# Patient Record
Sex: Female | Born: 1980 | Race: Black or African American | Hispanic: No | Marital: Single | Smoking: Never smoker
Health system: Southern US, Community
[De-identification: ages and names within clinical notes are randomized; demographics above are authoritative.]

## PROBLEM LIST (undated history)

## (undated) DIAGNOSIS — J302 Other seasonal allergic rhinitis: Secondary | ICD-10-CM

## (undated) DIAGNOSIS — I10 Essential (primary) hypertension: Secondary | ICD-10-CM

## (undated) DIAGNOSIS — F32A Depression, unspecified: Secondary | ICD-10-CM

## (undated) DIAGNOSIS — J45909 Unspecified asthma, uncomplicated: Secondary | ICD-10-CM

## (undated) DIAGNOSIS — F909 Attention-deficit hyperactivity disorder, unspecified type: Secondary | ICD-10-CM

## (undated) HISTORY — PX: TONSILLECTOMY: SUR1361

## (undated) HISTORY — PX: TUBAL LIGATION: SHX77

## (undated) HISTORY — PX: OTHER SURGICAL HISTORY: SHX169

## (undated) HISTORY — DX: Depression, unspecified: F32.A

---

## 2000-06-24 ENCOUNTER — Inpatient Hospital Stay (HOSPITAL_COMMUNITY): Admission: AD | Admit: 2000-06-24 | Discharge: 2000-06-24 | Payer: Self-pay | Admitting: Obstetrics & Gynecology

## 2000-07-22 ENCOUNTER — Inpatient Hospital Stay (HOSPITAL_COMMUNITY): Admission: AD | Admit: 2000-07-22 | Discharge: 2000-07-22 | Payer: Self-pay | Admitting: Obstetrics & Gynecology

## 2000-07-24 ENCOUNTER — Inpatient Hospital Stay (HOSPITAL_COMMUNITY): Admission: AD | Admit: 2000-07-24 | Discharge: 2000-07-24 | Payer: Self-pay | Admitting: Obstetrics

## 2000-07-27 ENCOUNTER — Encounter: Admission: RE | Admit: 2000-07-27 | Discharge: 2000-07-27 | Payer: Self-pay | Admitting: Obstetrics & Gynecology

## 2001-06-21 ENCOUNTER — Inpatient Hospital Stay (HOSPITAL_COMMUNITY): Admission: AD | Admit: 2001-06-21 | Discharge: 2001-06-21 | Payer: Self-pay | Admitting: *Deleted

## 2001-06-24 ENCOUNTER — Inpatient Hospital Stay (HOSPITAL_COMMUNITY): Admission: AD | Admit: 2001-06-24 | Discharge: 2001-06-24 | Payer: Self-pay | Admitting: *Deleted

## 2001-07-05 ENCOUNTER — Inpatient Hospital Stay (HOSPITAL_COMMUNITY): Admission: AD | Admit: 2001-07-05 | Discharge: 2001-07-05 | Payer: Self-pay | Admitting: *Deleted

## 2001-07-23 ENCOUNTER — Emergency Department (HOSPITAL_COMMUNITY): Admission: EM | Admit: 2001-07-23 | Discharge: 2001-07-23 | Payer: Self-pay | Admitting: Emergency Medicine

## 2001-07-23 ENCOUNTER — Encounter: Payer: Self-pay | Admitting: *Deleted

## 2002-02-09 ENCOUNTER — Ambulatory Visit (HOSPITAL_COMMUNITY): Admission: RE | Admit: 2002-02-09 | Discharge: 2002-02-09 | Payer: Self-pay | Admitting: *Deleted

## 2002-04-16 ENCOUNTER — Inpatient Hospital Stay (HOSPITAL_COMMUNITY): Admission: AD | Admit: 2002-04-16 | Discharge: 2002-04-16 | Payer: Self-pay | Admitting: *Deleted

## 2002-05-05 ENCOUNTER — Inpatient Hospital Stay (HOSPITAL_COMMUNITY): Admission: AD | Admit: 2002-05-05 | Discharge: 2002-05-10 | Payer: Self-pay | Admitting: Obstetrics and Gynecology

## 2002-05-06 ENCOUNTER — Encounter: Payer: Self-pay | Admitting: Obstetrics and Gynecology

## 2002-05-07 ENCOUNTER — Encounter: Payer: Self-pay | Admitting: Obstetrics and Gynecology

## 2002-05-07 ENCOUNTER — Encounter: Payer: Self-pay | Admitting: *Deleted

## 2002-05-12 ENCOUNTER — Encounter: Admission: RE | Admit: 2002-05-12 | Discharge: 2002-05-12 | Payer: Self-pay | Admitting: Family Medicine

## 2002-05-20 ENCOUNTER — Inpatient Hospital Stay (HOSPITAL_COMMUNITY): Admission: AD | Admit: 2002-05-20 | Discharge: 2002-05-20 | Payer: Self-pay | Admitting: *Deleted

## 2002-05-22 ENCOUNTER — Inpatient Hospital Stay (HOSPITAL_COMMUNITY): Admission: AD | Admit: 2002-05-22 | Discharge: 2002-05-22 | Payer: Self-pay | Admitting: *Deleted

## 2002-06-25 ENCOUNTER — Inpatient Hospital Stay (HOSPITAL_COMMUNITY): Admission: AD | Admit: 2002-06-25 | Discharge: 2002-06-25 | Payer: Self-pay | Admitting: Family Medicine

## 2002-06-30 ENCOUNTER — Ambulatory Visit (HOSPITAL_COMMUNITY): Admission: RE | Admit: 2002-06-30 | Discharge: 2002-06-30 | Payer: Self-pay | Admitting: *Deleted

## 2002-07-07 ENCOUNTER — Inpatient Hospital Stay (HOSPITAL_COMMUNITY): Admission: AD | Admit: 2002-07-07 | Discharge: 2002-07-07 | Payer: Self-pay | Admitting: *Deleted

## 2002-07-10 ENCOUNTER — Encounter: Payer: Self-pay | Admitting: Family Medicine

## 2002-07-10 ENCOUNTER — Inpatient Hospital Stay (HOSPITAL_COMMUNITY): Admission: AD | Admit: 2002-07-10 | Discharge: 2002-07-10 | Payer: Self-pay | Admitting: *Deleted

## 2002-07-12 ENCOUNTER — Inpatient Hospital Stay (HOSPITAL_COMMUNITY): Admission: AD | Admit: 2002-07-12 | Discharge: 2002-07-12 | Payer: Self-pay | Admitting: Obstetrics and Gynecology

## 2002-07-15 ENCOUNTER — Inpatient Hospital Stay (HOSPITAL_COMMUNITY): Admission: AD | Admit: 2002-07-15 | Discharge: 2002-07-18 | Payer: Self-pay | Admitting: *Deleted

## 2003-06-22 ENCOUNTER — Emergency Department (HOSPITAL_COMMUNITY): Admission: EM | Admit: 2003-06-22 | Discharge: 2003-06-22 | Payer: Self-pay | Admitting: Emergency Medicine

## 2004-12-25 ENCOUNTER — Ambulatory Visit (HOSPITAL_COMMUNITY): Admission: RE | Admit: 2004-12-25 | Discharge: 2004-12-25 | Payer: Self-pay | Admitting: *Deleted

## 2004-12-29 ENCOUNTER — Inpatient Hospital Stay (HOSPITAL_COMMUNITY): Admission: AD | Admit: 2004-12-29 | Discharge: 2004-12-29 | Payer: Self-pay | Admitting: Obstetrics and Gynecology

## 2005-01-16 ENCOUNTER — Inpatient Hospital Stay (HOSPITAL_COMMUNITY): Admission: RE | Admit: 2005-01-16 | Discharge: 2005-01-16 | Payer: Self-pay | Admitting: Obstetrics & Gynecology

## 2005-01-16 ENCOUNTER — Ambulatory Visit: Payer: Self-pay | Admitting: *Deleted

## 2005-04-09 ENCOUNTER — Inpatient Hospital Stay (HOSPITAL_COMMUNITY): Admission: AD | Admit: 2005-04-09 | Discharge: 2005-04-10 | Payer: Self-pay | Admitting: Obstetrics & Gynecology

## 2005-04-16 ENCOUNTER — Ambulatory Visit: Payer: Self-pay | Admitting: Obstetrics and Gynecology

## 2005-04-16 ENCOUNTER — Ambulatory Visit (HOSPITAL_COMMUNITY): Admission: RE | Admit: 2005-04-16 | Discharge: 2005-04-16 | Payer: Self-pay | Admitting: Obstetrics & Gynecology

## 2005-04-16 ENCOUNTER — Encounter (INDEPENDENT_AMBULATORY_CARE_PROVIDER_SITE_OTHER): Payer: Self-pay | Admitting: Specialist

## 2005-05-07 ENCOUNTER — Ambulatory Visit: Payer: Self-pay | Admitting: Obstetrics and Gynecology

## 2005-12-22 ENCOUNTER — Inpatient Hospital Stay (HOSPITAL_COMMUNITY): Admission: AD | Admit: 2005-12-22 | Discharge: 2005-12-22 | Payer: Self-pay | Admitting: Obstetrics and Gynecology

## 2006-01-05 ENCOUNTER — Inpatient Hospital Stay (HOSPITAL_COMMUNITY): Admission: AD | Admit: 2006-01-05 | Discharge: 2006-01-05 | Payer: Self-pay | Admitting: Family Medicine

## 2006-01-06 ENCOUNTER — Ambulatory Visit: Payer: Self-pay | Admitting: Family Medicine

## 2006-02-01 ENCOUNTER — Other Ambulatory Visit: Admission: RE | Admit: 2006-02-01 | Discharge: 2006-02-01 | Payer: Self-pay

## 2006-02-01 ENCOUNTER — Ambulatory Visit: Payer: Self-pay | Admitting: Family Medicine

## 2006-02-19 ENCOUNTER — Ambulatory Visit (HOSPITAL_COMMUNITY): Admission: RE | Admit: 2006-02-19 | Discharge: 2006-02-19 | Payer: Self-pay | Admitting: Family Medicine

## 2006-03-04 ENCOUNTER — Ambulatory Visit: Payer: Self-pay | Admitting: Family Medicine

## 2006-03-05 ENCOUNTER — Ambulatory Visit (HOSPITAL_COMMUNITY): Admission: AD | Admit: 2006-03-05 | Discharge: 2006-03-05 | Payer: Self-pay | Admitting: Family Medicine

## 2006-03-31 ENCOUNTER — Ambulatory Visit: Payer: Self-pay | Admitting: Sports Medicine

## 2006-04-13 ENCOUNTER — Ambulatory Visit: Payer: Self-pay | Admitting: Family Medicine

## 2006-05-03 ENCOUNTER — Ambulatory Visit: Payer: Self-pay | Admitting: Family Medicine

## 2006-05-05 ENCOUNTER — Inpatient Hospital Stay (HOSPITAL_COMMUNITY): Admission: AD | Admit: 2006-05-05 | Discharge: 2006-05-05 | Payer: Self-pay | Admitting: Gynecology

## 2006-05-05 ENCOUNTER — Ambulatory Visit: Payer: Self-pay | Admitting: *Deleted

## 2006-06-09 ENCOUNTER — Ambulatory Visit: Payer: Self-pay | Admitting: Sports Medicine

## 2006-06-09 ENCOUNTER — Encounter (INDEPENDENT_AMBULATORY_CARE_PROVIDER_SITE_OTHER): Payer: Self-pay | Admitting: Family Medicine

## 2006-06-09 LAB — CONVERTED CEMR LAB
Alkaline Phosphatase: 117 units/L (ref 39–117)
BUN: 7 mg/dL (ref 6–23)
CO2: 21 meq/L (ref 19–32)
Creatinine, Ser: 0.48 mg/dL (ref 0.40–1.20)
Glucose, Bld: 79 mg/dL (ref 70–99)
LDH: 147 units/L (ref 94–250)
Sodium: 137 meq/L (ref 135–145)
Total Bilirubin: 0.3 mg/dL (ref 0.3–1.2)
Total Protein: 6.6 g/dL (ref 6.0–8.3)
Uric Acid, Serum: 3.2 mg/dL (ref 2.4–7.0)

## 2006-06-14 ENCOUNTER — Inpatient Hospital Stay (HOSPITAL_COMMUNITY): Admission: AD | Admit: 2006-06-14 | Discharge: 2006-06-14 | Payer: Self-pay | Admitting: Gynecology

## 2006-06-17 ENCOUNTER — Ambulatory Visit: Payer: Self-pay | Admitting: *Deleted

## 2006-06-17 ENCOUNTER — Inpatient Hospital Stay (HOSPITAL_COMMUNITY): Admission: AD | Admit: 2006-06-17 | Discharge: 2006-06-17 | Payer: Self-pay | Admitting: Family Medicine

## 2006-06-30 ENCOUNTER — Encounter (INDEPENDENT_AMBULATORY_CARE_PROVIDER_SITE_OTHER): Payer: Self-pay | Admitting: Family Medicine

## 2006-06-30 ENCOUNTER — Ambulatory Visit: Payer: Self-pay | Admitting: Family Medicine

## 2006-06-30 LAB — CONVERTED CEMR LAB
Chlamydia, DNA Probe: NEGATIVE
GC Probe Amp, Genital: NEGATIVE

## 2006-07-07 ENCOUNTER — Ambulatory Visit: Payer: Self-pay | Admitting: Family Medicine

## 2006-07-18 ENCOUNTER — Inpatient Hospital Stay (HOSPITAL_COMMUNITY): Admission: AD | Admit: 2006-07-18 | Discharge: 2006-07-18 | Payer: Self-pay | Admitting: Gynecology

## 2006-07-18 ENCOUNTER — Ambulatory Visit: Payer: Self-pay | Admitting: Obstetrics and Gynecology

## 2006-07-20 ENCOUNTER — Ambulatory Visit: Payer: Self-pay

## 2006-07-21 ENCOUNTER — Ambulatory Visit: Payer: Self-pay | Admitting: Obstetrics & Gynecology

## 2006-07-23 ENCOUNTER — Ambulatory Visit: Payer: Self-pay | Admitting: Obstetrics & Gynecology

## 2006-07-25 ENCOUNTER — Ambulatory Visit: Payer: Self-pay | Admitting: Obstetrics and Gynecology

## 2006-07-25 ENCOUNTER — Inpatient Hospital Stay (HOSPITAL_COMMUNITY): Admission: AD | Admit: 2006-07-25 | Discharge: 2006-07-25 | Payer: Self-pay | Admitting: Family Medicine

## 2006-07-25 ENCOUNTER — Inpatient Hospital Stay (HOSPITAL_COMMUNITY): Admission: AD | Admit: 2006-07-25 | Discharge: 2006-07-28 | Payer: Self-pay | Admitting: Obstetrics

## 2006-08-05 DIAGNOSIS — J45909 Unspecified asthma, uncomplicated: Secondary | ICD-10-CM | POA: Insufficient documentation

## 2006-08-15 IMAGING — US US OB COMP LESS 14 WK
1 series · 14 of 18 positions shown · non-contrast
Comparison: none

CLINICAL DATA: Back pain.  
 OBSTETRICAL ULTRASOUND <14 WKS:
TECHNIQUE: Transabdominal ultrasound was performed for evaluation of the gestation as well as the maternal uterus and adnexal regions.

[Series 1: us ob comp less 14 wk · 0.29mm/px · 14 of 18 slices shown]
[im 1/18]
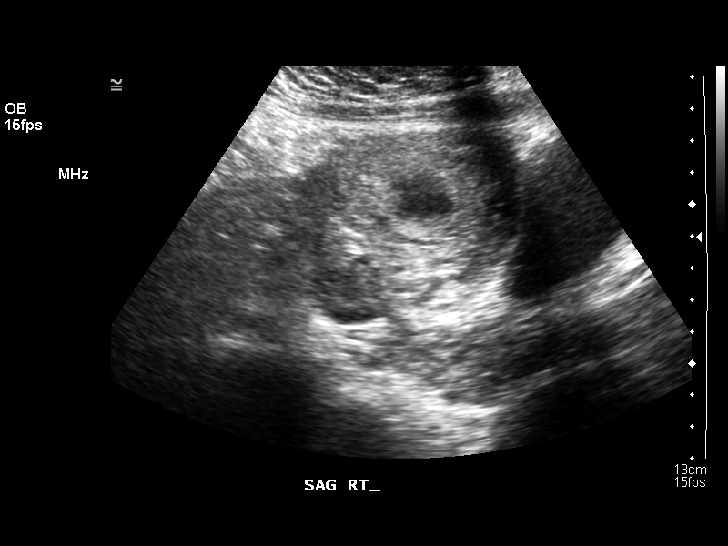
[im 2/18]
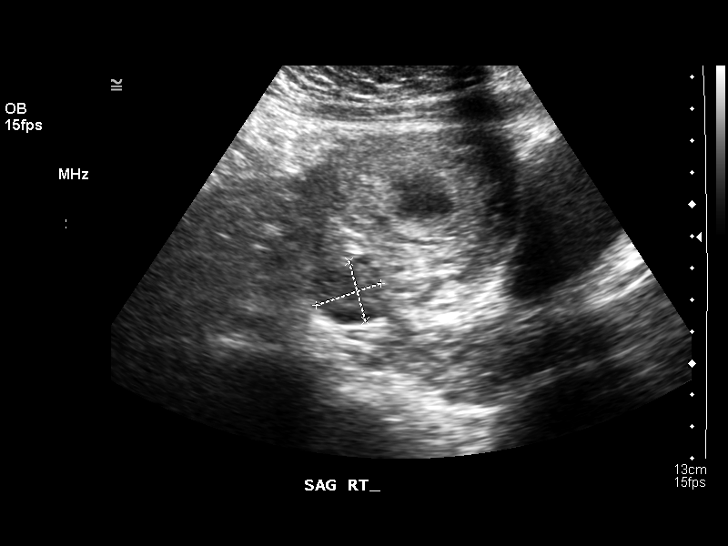
[im 4/18]
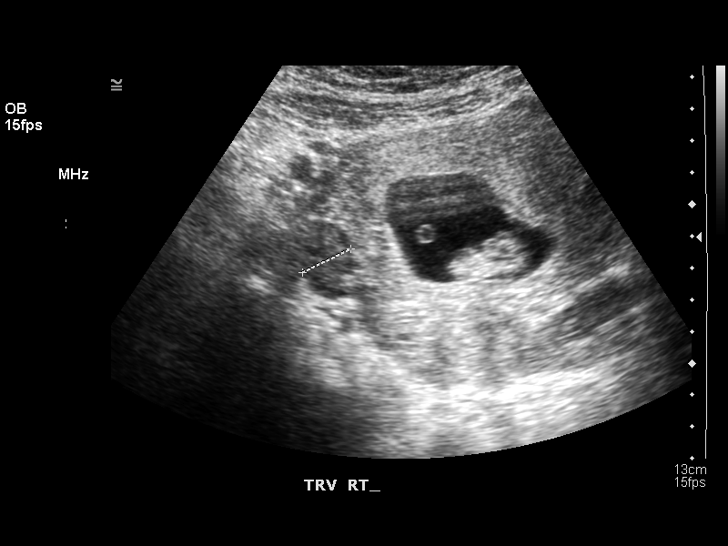
[im 5/18]
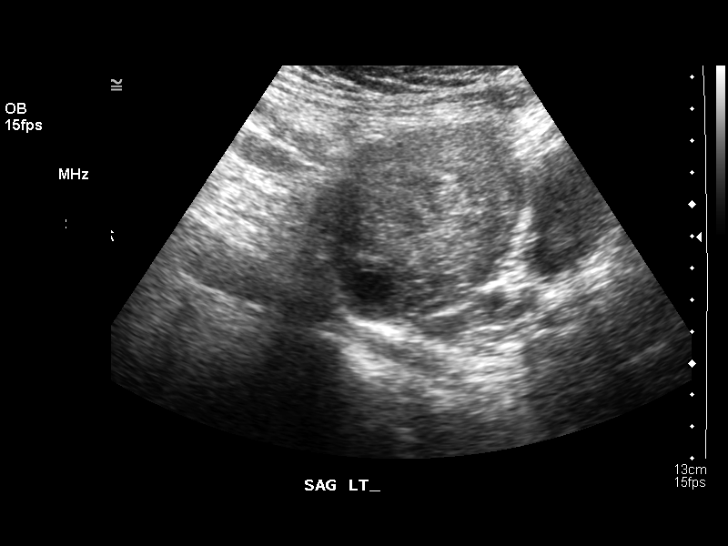
[im 6/18]
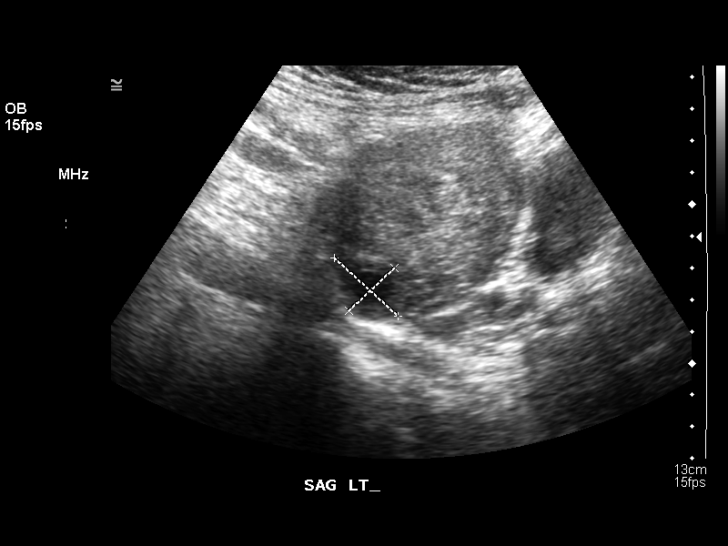
[im 8/18]
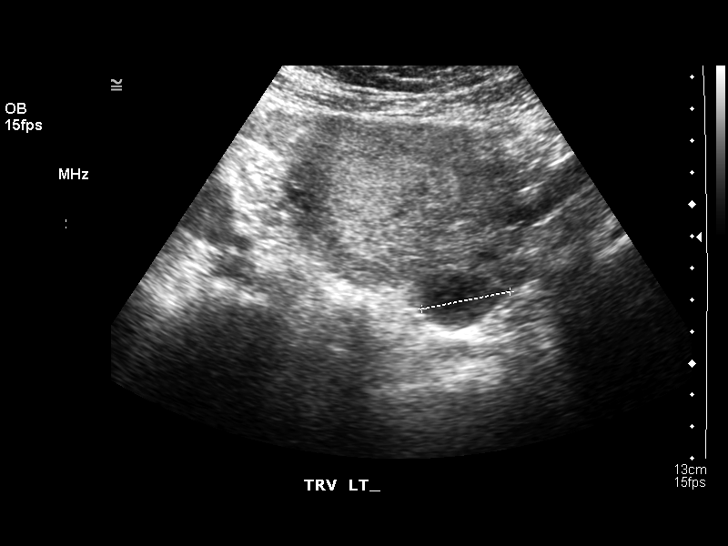
[im 9/18]
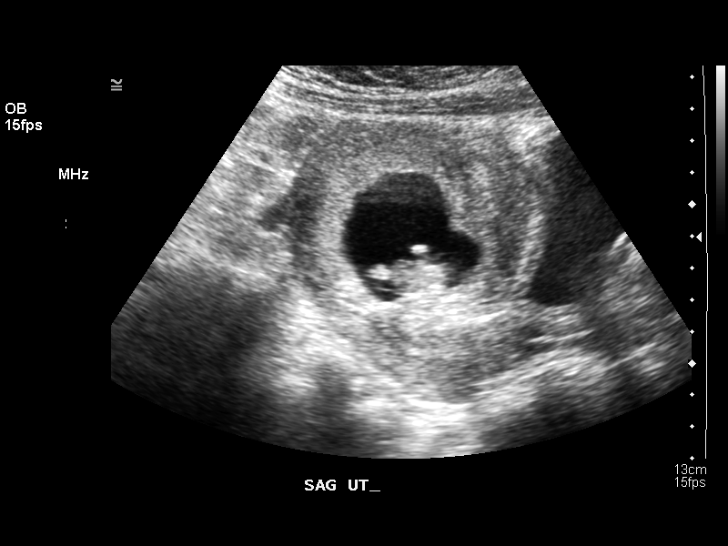
[im 10/18]
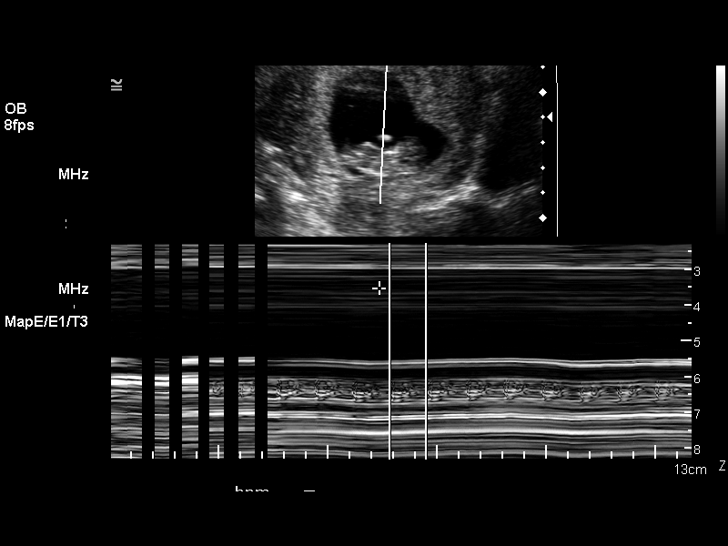
[im 11/18]
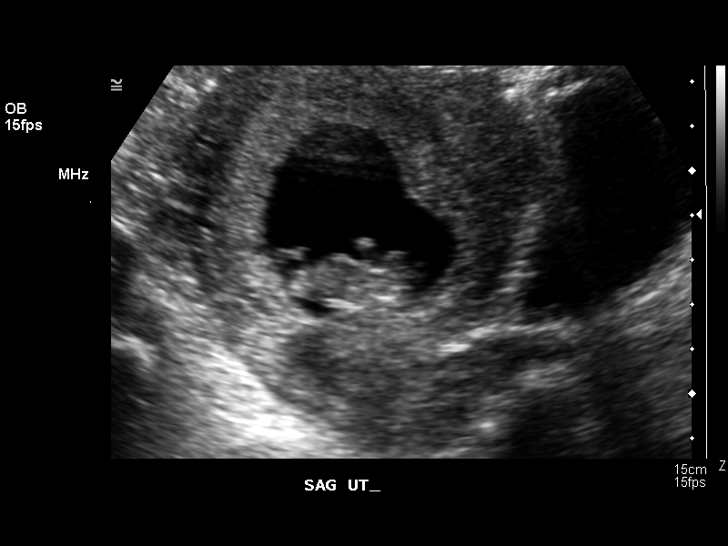
[im 13/18]
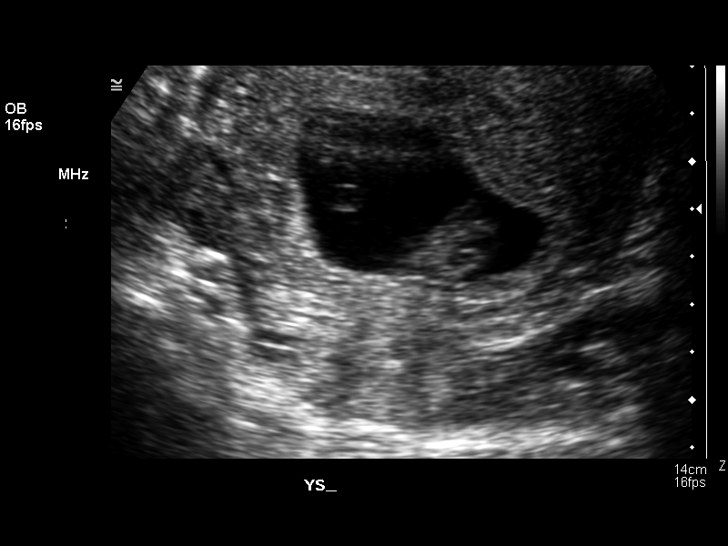
[im 14/18]
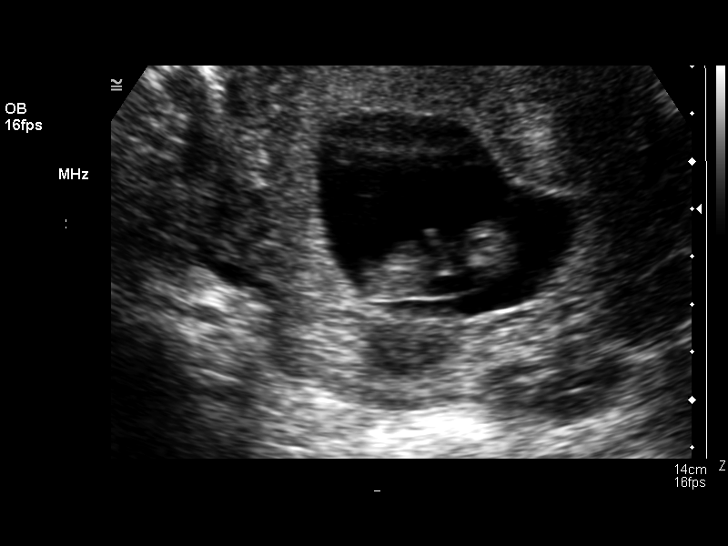
[im 15/18]
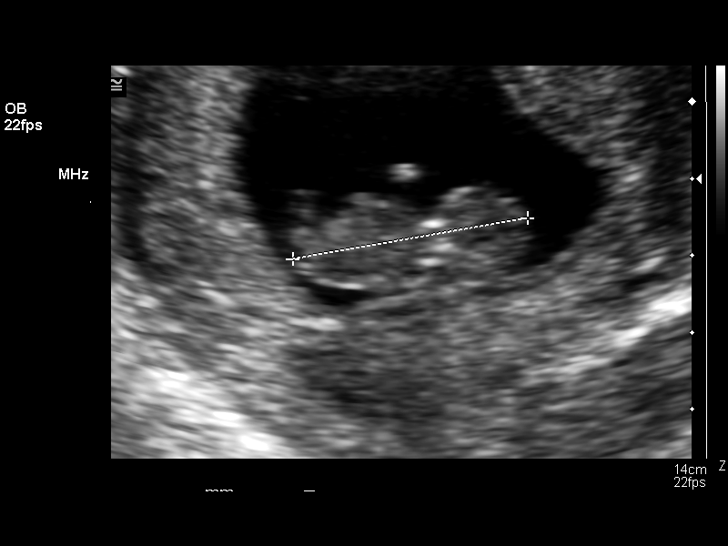
[im 17/18]
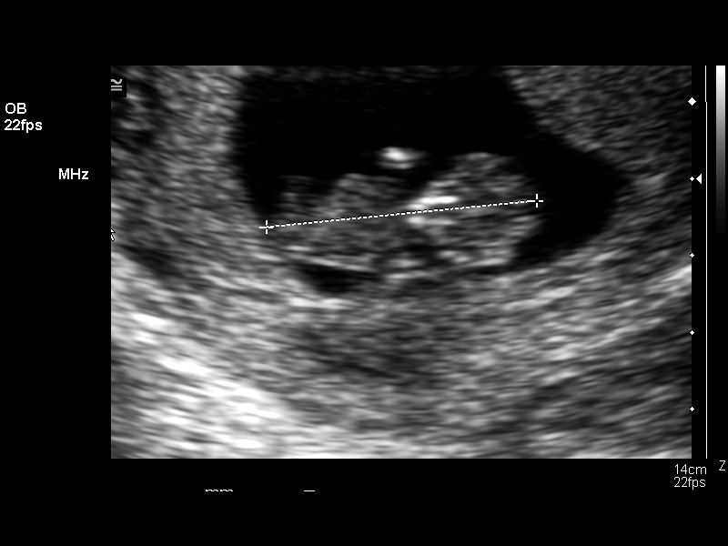
[im 18/18]
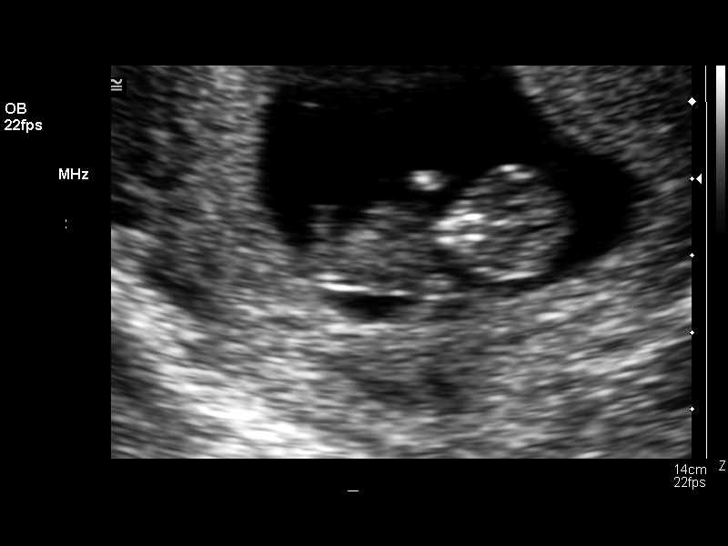

[14 of 18 positions shown; findings below may reference images not displayed]

FINDINGS: There is a single viable intrauterine gestation with crown rump length of 3.5 cm consistent with gestational age of 10 weeks and 3 days with EDC 07/17/06.  Fetal heart rate 180.  Fetal motion was noted, but not fetal breathing.  The fetus was in variable lie during the examination.  Normal amount of amniotic fluid.  Fetal anatomy was evaluated and this can be further delineated on follow-up.  Yolk sac and amnion were noted.  
 Ovaries have a normal appearance with a corpus luteum cyst along the left ovary.
IMPRESSION: 1.  Single viable intrauterine gestation with crown rump length suggesting 10 week 3 day gestational age with EDC of 07/17/06.  Heart rate 180.  Fetal anatomy not evaluated.
 2.  Normal amount of amniotic fluid.

## 2006-11-10 ENCOUNTER — Encounter (INDEPENDENT_AMBULATORY_CARE_PROVIDER_SITE_OTHER): Payer: Self-pay | Admitting: Family Medicine

## 2006-11-10 ENCOUNTER — Ambulatory Visit: Payer: Self-pay | Admitting: Family Medicine

## 2006-11-10 DIAGNOSIS — I10 Essential (primary) hypertension: Secondary | ICD-10-CM

## 2006-11-10 DIAGNOSIS — J309 Allergic rhinitis, unspecified: Secondary | ICD-10-CM | POA: Insufficient documentation

## 2006-11-10 DIAGNOSIS — N898 Other specified noninflammatory disorders of vagina: Secondary | ICD-10-CM | POA: Insufficient documentation

## 2006-11-10 LAB — CONVERTED CEMR LAB: GC Probe Amp, Genital: NEGATIVE

## 2006-12-13 ENCOUNTER — Ambulatory Visit: Payer: Self-pay | Admitting: Family Medicine

## 2006-12-16 ENCOUNTER — Ambulatory Visit: Payer: Self-pay | Admitting: Sports Medicine

## 2007-01-13 ENCOUNTER — Ambulatory Visit: Payer: Self-pay | Admitting: Family Medicine

## 2007-05-18 ENCOUNTER — Emergency Department (HOSPITAL_COMMUNITY): Admission: EM | Admit: 2007-05-18 | Discharge: 2007-05-18 | Payer: Self-pay | Admitting: Emergency Medicine

## 2007-05-19 ENCOUNTER — Encounter (INDEPENDENT_AMBULATORY_CARE_PROVIDER_SITE_OTHER): Payer: Self-pay | Admitting: Family Medicine

## 2007-05-19 ENCOUNTER — Other Ambulatory Visit: Admission: RE | Admit: 2007-05-19 | Discharge: 2007-05-19 | Payer: Self-pay | Admitting: Family Medicine

## 2007-05-19 ENCOUNTER — Ambulatory Visit: Payer: Self-pay | Admitting: Family Medicine

## 2007-05-19 LAB — CONVERTED CEMR LAB
Chlamydia, DNA Probe: NEGATIVE
GC Probe Amp, Genital: NEGATIVE

## 2007-05-25 ENCOUNTER — Encounter (INDEPENDENT_AMBULATORY_CARE_PROVIDER_SITE_OTHER): Payer: Self-pay | Admitting: Family Medicine

## 2007-05-25 LAB — CONVERTED CEMR LAB: Pap Smear: NORMAL

## 2007-05-29 ENCOUNTER — Encounter (INDEPENDENT_AMBULATORY_CARE_PROVIDER_SITE_OTHER): Payer: Self-pay | Admitting: Family Medicine

## 2007-08-17 ENCOUNTER — Encounter: Payer: Self-pay | Admitting: *Deleted

## 2007-08-21 ENCOUNTER — Emergency Department (HOSPITAL_COMMUNITY): Admission: EM | Admit: 2007-08-21 | Discharge: 2007-08-21 | Payer: Self-pay | Admitting: Emergency Medicine

## 2007-09-01 ENCOUNTER — Inpatient Hospital Stay (HOSPITAL_COMMUNITY): Admission: AD | Admit: 2007-09-01 | Discharge: 2007-09-02 | Payer: Self-pay | Admitting: Obstetrics & Gynecology

## 2008-02-08 ENCOUNTER — Encounter: Payer: Self-pay | Admitting: *Deleted

## 2008-04-28 ENCOUNTER — Emergency Department (HOSPITAL_COMMUNITY): Admission: EM | Admit: 2008-04-28 | Discharge: 2008-04-28 | Payer: Self-pay | Admitting: Emergency Medicine

## 2008-07-12 ENCOUNTER — Ambulatory Visit: Payer: Self-pay | Admitting: Family Medicine

## 2008-07-12 DIAGNOSIS — Z8742 Personal history of other diseases of the female genital tract: Secondary | ICD-10-CM

## 2008-07-12 DIAGNOSIS — E669 Obesity, unspecified: Secondary | ICD-10-CM

## 2008-07-12 DIAGNOSIS — N912 Amenorrhea, unspecified: Secondary | ICD-10-CM

## 2008-07-12 LAB — CONVERTED CEMR LAB: Beta hcg, urine, semiquantitative: NEGATIVE

## 2008-08-19 ENCOUNTER — Emergency Department (HOSPITAL_COMMUNITY): Admission: EM | Admit: 2008-08-19 | Discharge: 2008-08-19 | Payer: Self-pay | Admitting: Emergency Medicine

## 2009-09-23 ENCOUNTER — Encounter: Payer: Self-pay | Admitting: Family Medicine

## 2010-01-31 ENCOUNTER — Other Ambulatory Visit: Admission: RE | Admit: 2010-01-31 | Discharge: 2010-01-31 | Payer: Self-pay | Admitting: Family Medicine

## 2010-01-31 ENCOUNTER — Ambulatory Visit: Payer: Self-pay | Admitting: Family Medicine

## 2010-01-31 DIAGNOSIS — L680 Hirsutism: Secondary | ICD-10-CM | POA: Insufficient documentation

## 2010-01-31 DIAGNOSIS — I10 Essential (primary) hypertension: Secondary | ICD-10-CM | POA: Insufficient documentation

## 2010-02-03 ENCOUNTER — Encounter: Admission: RE | Admit: 2010-02-03 | Discharge: 2010-02-03 | Payer: Self-pay | Admitting: Family Medicine

## 2010-02-05 ENCOUNTER — Ambulatory Visit: Payer: Self-pay | Admitting: Family Medicine

## 2010-02-05 ENCOUNTER — Encounter: Payer: Self-pay | Admitting: Family Medicine

## 2010-02-05 LAB — CONVERTED CEMR LAB
AST: 22 units/L (ref 0–37)
Albumin: 4 g/dL (ref 3.5–5.2)
Alkaline Phosphatase: 78 units/L (ref 39–117)
BUN: 11 mg/dL (ref 6–23)
Glucose, Bld: 88 mg/dL (ref 70–99)
HDL: 34 mg/dL — ABNORMAL LOW (ref >39)
LDL Cholesterol: 123 mg/dL — ABNORMAL HIGH (ref 0–99)
Potassium: 4.4 meq/L (ref 3.5–5.3)
Sodium: 138 meq/L (ref 135–145)
TSH: 2.665 microintl units/mL (ref 0.350–4.500)
Total Bilirubin: 0.4 mg/dL (ref 0.3–1.2)
Total Protein: 7.5 g/dL (ref 6.0–8.3)
Triglycerides: 129 mg/dL (ref <150)
VLDL: 26 mg/dL (ref 0–40)

## 2010-02-06 ENCOUNTER — Encounter: Payer: Self-pay | Admitting: Family Medicine

## 2010-07-08 NOTE — Assessment & Plan Note (Signed)
Summary: Obesity (suspect PCOS), HTN, Hirsutism, PAP   Vital Signs:  Patient profile:   30 year old female Height:      65 inches Weight:      294 pounds BMI:     49.10 Temp:     98.3 degrees F oral Pulse rate:   76 / minute BP sitting:   153 / 98  (left arm) Cuff size:   regular  Vitals Entered By: Jimmy Footman, CMA (January 31, 2010 9:13 AM) CC: Obesity, HTN, Pap Is Patient Diabetic? No    Primary Care Provider:  Jamie Brookes MD  CC:  Obesity, HTN, and Pap.  History of Present Illness:   hypertension: I'm concerned about her BP today. She has an elevated BP of 153/98. The last time she was here it was a little elevated but we did not start meds. Today we discussed starting medications. The patient had to take medications during her pregnancy as well.   ? PCOS: Pt has signs of PCOS. She reports being told that she had a cyst on her Rt ovary. She also has fairly sever hirsutism, she is obese (obesity class 3), and she has a family h/o diabetes so she may be glucose intolerant. Discussed the signs and symptoms and treatments today. Pt has concerns about the hair but has been using nair or waxing. She says the hirsutism developed after the birth of her first child and has just continued.    PAP: Pt is a 30 y/o F who is here for a pap smear. She has not major concerns for herself.  Habits & Providers  Alcohol-Tobacco-Diet     Tobacco Status: never  Current Medications (verified): 1)  Ventolin Hfa 108 (90 Base) Mcg/act Aers (Albuterol Sulfate) .Marland Kitchen.. 1 Puff Every 4-6 Hours As Needed For Shortness of Breath 2)  Metformin Hcl 500 Mg Tabs (Metformin Hcl) .... Take 1 Pill Every Night For 1 Week, Then  One Every Morning and One Every Evening. 3)  Hydrochlorothiazide 12.5 Mg Caps (Hydrochlorothiazide) .... Take 1 Pill Every Morning  Allergies (verified): No Known Drug Allergies  Review of Systems        vitals reviewed and pertinent negatives and positives seen in  HPI   Physical Exam  General:  Well-developed,well-nourished,in no acute distress; alert,appropriate and cooperative throughout examination Lungs:  Normal respiratory effort, chest expands symmetrically. Lungs are clear to auscultation, no crackles or wheezes. Heart:  Normal rate and regular rhythm. S1 and S2 normal without gallop, murmur, click, rub or other extra sounds. Abdomen:  Bowel sounds positive,abdomen soft and non-tender without masses, organomegaly or hernias noted, obese Genitalia:  Normal introitus for age, no external lesions, no vaginal discharge, mucosa pink and moist, no vaginal or cervical lesions, no vaginal atrophy, minimal friaility and hemorrhage, normal uterus size and position, no adnexal masses or tenderness: pt is obese and it is difficult to feel all organs but what is felt is normal.   Impression & Recommendations:  Problem # 1:  OBESITY (ICD-278.00) Assessment New I suspect this patient has PCOS. Plan to do some labs to look for other causes of the obestiy and hirsutism. Plan to do [place] of ovaries to looks for cysts. Pt says that she has had a cyst in the past and has been feeling like she has one again. If she does have PCOS will start on birth control pills. Likely will use Norethindone for 6 months to see how she responds. She has had a tubal ligation in the  past and has very irregular menstration now.   Orders: Ultrasound (Ultrasound) [facility]- Est  Level 4 (99214)Future Orders: FSH-[facility] ([id number]) ... 01/21/2011 Prolactin-[facility] ([id number]) ... 01/22/2011 TSH-[facility] [id number]) ... 01/15/2011 Comp Met-[facility] ([id number]) ... 01/23/2011 Lipid-[facility] ([id number]) ... 01/22/2011  Problem # 2:  ESSENTIAL HYPERTENSION (ICD-401.9) Assessment: New Pt has elevated BP at this visit. Plan to have her take this medication for about 3 weeks. Will get a CMP in the next couple of days. Will then monitor again in the future for any eletrolyte disturbances.   Her updated  medication list for this problem includes:    Hydrochlorothiazide 12.5 Mg Caps (Hydrochlorothiazide) .Marland Kitchen... Take 1 pill every morning  Problem # 3:  HIRSUTISM (ICD-704.1) Assessment: New I don't remember this patient having so much facial and chest pain. She may have been shaving it the last time I saw her. She is concerned about it. She uses Darene Lamer and waxing. Would consider Vaniqe at the next appointment if the patient can get it. Testing for other causes of hirsutism.   Problem # 4:  SCREENING FOR MALIGNANT NEOPLASM OF THE CERVIX (ICD-V76.2) Assessment: Deteriorated Pt was due for a pap smear. Cervix easily friable. Will call pt with results.   Orders: Pap Smear-[facility] ([id number]) Pap Smear-[facility] ([id number]) [facility]- Est  Level 4 (00938)  Complete Medication List: 1)  Ventolin Hfa 108 (90 Base) Mcg/act Aers (Albuterol sulfate) .Marland Kitchen.. 1 puff every 4-6 hours as needed for shortness of breath 2)  Metformin Hcl 500 Mg Tabs (Metformin hcl) .... Take 1 pill every night for 1 week, then  one every morning and one every evening. 3)  Hydrochlorothiazide 12.5 Mg Caps (Hydrochlorothiazide) .... Take 1 pill every morning  Patient Instructions: 1)  I am concerned that you have Polycystic ovarian syndrome.  2)  We need to do some testing to confirm this.  3)  We are starting 2 different meds today.  4)  One for blood pressure= Hydrocholothiazide.  5)  One for blood sugar and weight loss= Metformin 6)  Please pick them up and start them today.  7)  Come back on Mon or Tues to get fasting labs.  8)  I will see you in 3 weeks to go over labs and ultrasound.  Prescriptions: VENTOLIN HFA 108 (90 BASE) MCG/ACT AERS (ALBUTEROL SULFATE) 1 puff every 4-6 hours as needed for shortness of breath  #2 x 2   Entered and Authorized by:   Jamie Brookes MD   Signed by:   Jamie Brookes MD on 01/31/2010   Method used:   Electronically to        [facility] (retail)       [address]        [place], [place]  [id number]       Ph: [phone]       Fax: [phone]   RxID:   [id number] HYDROCHLOROTHIAZIDE 12.5 MG CAPS (HYDROCHLOROTHIAZIDE) take 1 pill every morning  #31 x 2   Entered and Authorized by:   Jamie Brookes MD   Signed by:   Jamie Brookes MD on 01/31/2010   Method used:   Electronically to        [facility] (retail)       [address]       [place], [place]  [id number]       Ph: [phone]       Fax: [phone]   RxID:   [id number] METFORMIN HCL 500 MG TABS (METFORMIN  HCL) take 1 pill every night for 1 week, then  one every morning and one every evening.  #60 x 2   Entered and Authorized by:   Jamie Brookes MD   Signed by:   Jamie Brookes MD on 01/31/2010   Method used:   Electronically to        [facility] [address] (retail)       [address]       [place], [place]  [id number]       Ph: [phone]       Fax: [phone]   RxID:   [id number]

## 2010-07-08 NOTE — Miscellaneous (Signed)
Summary: LM  Clinical Lists Changes LM for her to call me. she had asked about referral for therapy. will need to be seen her first. will also warn her again about our no show policy.[place] Circle RN  Gwendelyn 18, 2011 9:34 AM  yes, she will need to be seen so I can so a referral if necessary. Jamie Brookes MD  Jazmeen 18, 2011 1:32 PM  Appended Document: LM lm again

## 2010-07-08 NOTE — Miscellaneous (Signed)
Summary: PCOS treatment with [facility]   Clinical Lists Changes  Medications: Added new medication of ORTHO EVRA 150-20 MCG/24HR PTWK (NORELGESTROMIN-ETH ESTRADIOL) 1st patch on Sunday, leave on for 1 week, take old patch off and replace x 3 weeks total, then leave off for 1 week. - Signed Rx of ORTHO EVRA 150-20 MCG/24HR PTWK (NORELGESTROMIN-ETH ESTRADIOL) 1st patch on Sunday, leave on for 1 week, take old patch off and replace x 3 weeks total, then leave off for 1 week.;  #9 x 1;  Signed;  Entered by: Jaicob Dia MD;  Authorized by: Aristotelis Vilardi MD;  Method used: Electronically to [facility] [address] [address], [place], [place]  [id number], Ph: [phone], Fax: [phone]    Prescriptions: ORTHO EVRA 150-20 MCG/24HR PTWK (NORELGESTROMIN-ETH ESTRADIOL) 1st patch on Sunday, leave on for 1 week, take old patch off and replace x 3 weeks total, then leave off for 1 week.  #9 x 1   Entered and Authorized by:   Che Below MD   Signed by:   Dillyn Joaquin MD on 02/06/2010   Method used:   Electronically to        [facility] (retail)       [address]       [place], [place]  [id number]       Ph: [phone]       Fax: [phone]   RxID:   [id number]   Pt given Rx for birth control so that she can get her menstration regulated and also treat the symptoms of PCOS. If she does not have relief of hirsutism will consider adding anti-testosterone meds.

## 2010-07-09 ENCOUNTER — Encounter: Payer: Self-pay | Admitting: *Deleted

## 2010-10-14 ENCOUNTER — Encounter: Payer: Self-pay | Admitting: Family Medicine

## 2010-10-24 NOTE — Group Therapy Note (Signed)
NAME:  Sherri Campbell, Sherri Campbell NO.:  [id number]   MEDICAL RECORD NO.:  [id number]          PATIENT TYPE:  WOC   LOCATION:  [facility]                   FACILITY:  [facility]   PHYSICIAN:  Elsie Lincoln, MD      DATE OF BIRTH:  Feb 26, 1981   DATE OF SERVICE:  01/16/2005                                    CLINIC NOTE   HISTORY OF PRESENT ILLNESS:  The patient is a 30 year old G4 P1-0-2-1 that  presents for follow-up on a spontaneous abortion. The patient was seen in  the [facility] on December 29, 2004 with vaginal bleeding and cramping. Ultrasound at  that time showed findings consistent with an 8-week 5-day pregnancy  consistent with a blighted ovum. Dr. Penne Lash was consulted and the patient  was given Cytotec 800 mg x1 and ibuprofen. She also received a RhoGAM shot.  Since that time the patient reports that she has passed two large clots on  July 25 and she has continued to have some alternating days of heavy and  light bleeding since January 01, 2005. She denies any fever or chills or night  sweats. She also endorses some pressure with urination.   PHYSICAL EXAMINATION:  VITAL SIGNS:  Blood pressure 136/86, pulse 69, weight  269.9.  GENERAL:  Well-developed, well-nourished female in no apparent distress.  HEENT:  Normocephalic, atraumatic. She is hirsute.  CARDIOVASCULAR:  Regular rate and rhythm.  LUNGS:  Clear to auscultation bilaterally.  ABDOMEN:  Gravid but nontender.  PELVIC:  Bimanual exam is limited secondary to her body habitus. Adnexa are  free of masses.   LABORATORY DATA:  Urine is negative, pregnancy test is positive.   ASSESSMENT:  Missed abortion of a blighted ovum.   PLAN:  Will send the patient for ultrasound to assess for retained products.  Anticipate that she may need a dilation and curettage. The patient was  discussed with Dr. Penne Lash.       TW/MEDQ  D:  01/16/2005  T:  01/16/2005  Job:  [id number]

## 2010-10-24 NOTE — Discharge Summary (Signed)
NAME:  Sherri Campbell, Sherri Campbell                          ACCOUNT NO.:  [id number]   MEDICAL RECORD NO.:  [id number]                   PATIENT TYPE:  INP   LOCATION:  9148                                 [facility]   PHYSICIAN:  Phil D. Okey Dupre, M.D.                  DATE OF BIRTH:  [birth year]   DATE OF ADMISSION:  05/05/2002  DATE OF DISCHARGE:  05/10/2002                                 DISCHARGE SUMMARY   DISCHARGE DIAGNOSES:  1. Community acquired pneumonia.  2. Asthma exacerbation.  3. Intrauterine pregnancy at 30 and 6/7 weeks at time of discharge.   DISCHARGE MEDICATIONS:  1. Azithromycin 500 mg one tablet p.o. q.d. x3 days.  2. Flovent 110 mg MDI with spacer one puff b.i.d.  3. Albuterol MDI with spacer one to two puffs q.4-6h. p.r.n. for difficulty     in breathing.  4. Medrol dose pack x5 days.   DISPOSITION:  The patient was discharged home in stable condition.   FOLLOW UP:  1. Dr. Olene Floss at [facility] May 12, 2002 at 3:15.  2. The patient to keep her next appointment at [facility] to continue     her obstetrics care there.   HISTORY AND PHYSICAL:  The patient is a 30 year old G3, P0-0-2-0 who  presented to [facility] ED at 30 and 1/7 weeks complaining of fevers  and chills, cough, nausea and vomiting, and abdominal pain x2 days.  She had  the flu shot on November 24.  She has asthma, but has not been using an MDI.  She denied contractions, loss of fluid, vaginal bleeding.  She states that  the fetus has been active.  The patient receives her obstetric care at  [facility].   PHYSICAL EXAMINATION:  VITAL SIGNS:  Temperature 100.7, heart rate 127-138,  blood pressure 109/64.  HEART:  She was tachycardic without ectopy.  LUNGS:  Diffuse wheezing, but without crackles or rhonchi.  ABDOMEN:  The patient has a gravid abdomen that is obese, but nontender.  EXTREMITIES:  She has no peripheral edema.   Fetal heart tracings showed a baseline of 170  with good variability and  reactivity without decelerations.   LABORATORIES:  Rapid Strep test that was negative.  Electrolytes:  Sodium  138, potassium 4.1, chloride 110, bicarbonate 17, BUN 4, creatinine 0.5,  glucose 70, calcium 8.0.  Chest x-ray showed a right middle lobe atelectasis  versus infiltrate.   HOSPITAL COURSE:  The patient was admitted for acute febrile illness and  asthma exacerbation.  Problem 1 - LIKELY COMMUNITY ACQUIRED PNEUMONIA:  Ceftriaxone and  azithromycin were begun.  The patient was treated symptomatically for her  cough with Robitussin with codeine.  She initially required supplemental  oxygen.  We were able to wean her oxygen over her admission.  By time of  discharge she was maintaining her oxygen saturation in the high  90s even  with ambulation.  The patient was afebrile for 72 hours prior to discharge.  Problem 2 - ASTHMA EXACERBATION:  The patient was given a Decadron steroid  burst.  She had scheduled albuterol nebulizers on the first day of admission  which were weaned to p.r.n. basis.  The patient only required one albuterol  breathing treatment in the 24 hours prior to discharge.  Problem 3 - DIARRHEA:  The patient developed diarrhea one day after starting  antibiotics.  Stool culture was negative.  Clostridium difficile was  negative likely secondary to antibiotic use.  Problem 4 - INTRAUTERINE PREGNANCY:  Stable throughout this admission  without uterine contractions, loss of fluid, or bleeding.  Reassuring fetal  heart tones.   DISCHARGE LABORATORIES:  Sodium 142, potassium 3.7, chloride 110,  bicarbonate 23, BUN 4, creatinine 0.4, glucose 93, calcium 7.8.     Barney Drain, M.D.                       Phil D. Okey Dupre, M.D.    SS/MEDQ  D:  05/10/2002  T:  05/10/2002  Job:  [id number]

## 2010-10-24 NOTE — Op Note (Signed)
Sherri Campbell, Sherri Campbell                ACCOUNT NO.:  [id number]   MEDICAL RECORD NO.:  [id number]          PATIENT TYPE:  AMB   LOCATION:  [facility]                           [facility]   PHYSICIAN:  Phil D. Okey Dupre, M.D.     DATE OF BIRTH:  07-Apr-1981   DATE OF PROCEDURE:  04/16/2005  DATE OF DISCHARGE:                                 OPERATIVE REPORT   PROCEDURE:  Dilatation and evacuation.   PREOPERATIVE DIAGNOSIS:  Incomplete abortion.   POSTOPERATIVE DIAGNOSIS:  Incomplete abortion.   SURGEON:  Javier Glazier. Okey Dupre, M.D.   ANESTHESIA:  MAC plus local.   SPECIMENS:  Products of conception sent to pathology.   POSTOPERATIVE CONDITION:  Satisfactory.   ESTIMATED BLOOD LOSS:  Minimal.   PROCEDURE:  Under satisfactory MAC sedation with the patient in dorsal  lithotomy position, the perineum and vagina were prepped and draped in the  usual sterile manner.  Bimanual pelvic examination under anesthesia revealed  a uterus of about eight weeks' gestation size, freely movable and soft,  normal adnexa.  A weighted speculum placed in the posterior fourchette of the vagina.  The  anterior lip of the cervix was grasped with a single-tooth tenaculum.  Xylocaine 1% 10 mL was injected into each of the paracervical areas at 8 and  4 o'clock for patient comfort.  The uterine cavity was sounded to a depth of  8 cm, cervical os dilated to #10 Hagar dilator, and a #10 curved curette was  used to evacuate the uterine contents without incident with minimal blood  loss during the procedure.  Tenaculum and speculum were removed from the  vagina.  The patient transferred to the recovery room in satisfactory  condition, having tolerated the procedure well.           ______________________________  Javier Glazier. Okey Dupre, M.D.     PDR/MEDQ  D:  04/16/2005  T:  04/16/2005  Job:  [id number]

## 2010-10-24 NOTE — Op Note (Signed)
Sherri Campbell, Sherri Campbell                ACCOUNT NO.:  [id number]   MEDICAL RECORD NO.:  [id number]          PATIENT TYPE:  INP   LOCATION:  9132                          [facility]   PHYSICIAN:  Allie Bossier, MD        DATE OF BIRTH:  June 08, 1981   DATE OF PROCEDURE:  07/27/2006  DATE OF DISCHARGE:                               OPERATIVE REPORT   PREOPERATIVE DIAGNOSIS:  Desire for permanent sterilization.   POSTOPERATIVE DIAGNOSIS:  Desire for permanent sterilization.   PROCEDURE:  Open postpartum tubal ligation with Filshie clip.   ANESTHESIA:  Epidural with sedation.   SURGEON:  Allie Bossier, MD.   ASSISTANTMarc Morgans. Mayford Knife, M.D.   ESTIMATED BLOOD LOSS:  Minimal.   SPECIMENS:  None.   COMPLICATIONS:  None.   DESCRIPTION OF PROCEDURE:  Prior to surgery, she understood the failure  rate of tubal ligation and declined alternative forms of birth control.  Patient was taken to the operating room where spinal anesthesia was  found to be adequate.  She was prepped and draped in the usual sterile  fashion.  Marcaine was injected infraumbilically before starting.  Small, approximately 3 cm, infraumbilical incision was made and carried  through the fascia and peritoneum.  Right fallopian tube was grasped  with Babcock clamps, brought to the surface and followed out to the  fimbria.  Then a Filshie clip was placed in the isthmic region.  Fallopian tube was returned to the abdomen with no problems.  Excellent  hemostasis was maintained.  Attention was then turned to the left  fallopian tube which in a similar fashion was grasped with a Babcock  clamp and followed out to the fimbria.  Next, a Filshie clip was placed  in the isthmic region.  The fallopian tube was returned to the abdomen  and hemostasis was maintained.  Next, fascia was closed with 0 Vicryl in  a running fashion.  Skin was closed with Vicryl.  Patient tolerated the  procedure well.  There were no  complications.     ______________________________  Marc Morgans Mayford Knife, M.D.      Allie Bossier, MD  Electronically Signed    TLW/MEDQ  D:  07/27/2006  T:  07/27/2006  Job:  [id number]

## 2011-02-20 ENCOUNTER — Encounter: Payer: Self-pay | Admitting: Family Medicine

## 2011-03-02 LAB — URINALYSIS, ROUTINE W REFLEX MICROSCOPIC
Glucose, UA: 250 — AB
Hgb urine dipstick: NEGATIVE
Specific Gravity, Urine: 1.015
pH: 5.5

## 2011-03-02 LAB — WET PREP, GENITAL: Yeast Wet Prep HPF POC: NONE SEEN

## 2011-03-02 LAB — HERPES SIMPLEX VIRUS CULTURE

## 2011-03-02 LAB — GC/CHLAMYDIA PROBE AMP, GENITAL: GC Probe Amp, Genital: NEGATIVE

## 2011-08-10 ENCOUNTER — Ambulatory Visit (INDEPENDENT_AMBULATORY_CARE_PROVIDER_SITE_OTHER): Payer: Self-pay | Admitting: Family Medicine

## 2011-08-10 ENCOUNTER — Encounter: Payer: Self-pay | Admitting: Family Medicine

## 2011-08-10 VITALS — BP 162/94 | HR 78 | Temp 98.1°F | Ht 65.0 in | Wt 307.0 lb

## 2011-08-10 DIAGNOSIS — R739 Hyperglycemia, unspecified: Secondary | ICD-10-CM | POA: Insufficient documentation

## 2011-08-10 DIAGNOSIS — N912 Amenorrhea, unspecified: Secondary | ICD-10-CM

## 2011-08-10 DIAGNOSIS — I10 Essential (primary) hypertension: Secondary | ICD-10-CM

## 2011-08-10 DIAGNOSIS — R7309 Other abnormal glucose: Secondary | ICD-10-CM

## 2011-08-10 LAB — POCT GLYCOSYLATED HEMOGLOBIN (HGB A1C): Hemoglobin A1C: 5.9

## 2011-08-10 LAB — POCT URINE PREGNANCY: Preg Test, Ur: NEGATIVE

## 2011-08-10 NOTE — Progress Notes (Signed)
  Subjective:    Patient ID: Sherri Campbell, female    DOB: [birth year], 31 y.o.   MRN: [id number]  HPI Amenorrhea-patient is having regular periods for the last several years. Her last period started 05/20/2011 and lasted for 14 days. The one before that was in [place]. She has symptoms of PCO S. including hirsutism and metabolic syndrome. She has just started to exercise and watch her diet more. She denies discharge or itch. She is having unprotected sex with her partner, but they are not monogamous relationship. She considers him her fianc.  Hypertension-patient with diagnosis of hypertension in past. She never started HCTZ. She is going to start those today. She denies headaches or shortness of breath or chest pain.  High blood sugars-patient with history of GBM. Father with diabetes as well as paternal grand mother. She is concerned today to get her sugar rechecked. She has not been checking her sugars at home.   Review of Systems    Denies CP, SOB, HA, N/V/D, fever  Objective:   Physical Exam Vital signs reviewed General appearance - alert, well appearing, and in no distress and oriented to person, place, and time Heart - normal rate, regular rhythm, normal S1, S2, no murmurs, rubs, clicks or gallops Chest - clear to auscultation, no wheezes, rales or rhonchi, symmetric air entry, no tachypnea, retractions or cyanosis Abdomen - soft, nontender, nondistended, no masses or organomegaly Extremities - peripheral pulses normal, no pedal edema, no clubbing or cyanosis        Assessment & Plan:

## 2011-08-10 NOTE — Assessment & Plan Note (Signed)
History of gestational diabetes. Will check A1c today. We'll also recheck cholesterol. Patient with metabolic syndrome and previous question of PCOS.

## 2011-08-10 NOTE — Assessment & Plan Note (Signed)
Patient with irregular periods likely secondary to PCO S. Previous BTL. u preg today negative.

## 2011-08-10 NOTE — Assessment & Plan Note (Signed)
BP Readings from Last 3 Encounters:  08/10/11 162/94  01/31/10 153/98  07/12/08 137/88   Patient with hypertension. Check CMET today. Would like to start ACE inhibitor. See back in one week from start. Plan see back in one month.

## 2011-08-10 NOTE — Patient Instructions (Signed)
I will call you at [phone] with the results of your labs We will likely have to start a blood pressure medicine for you. Please call and make a nurse appointment for one week after you start your new medicine You'll have to get your blood drawn on that day as well. Please come back and see me in one month.

## 2011-08-11 LAB — COMPREHENSIVE METABOLIC PANEL
ALT: 45 U/L — ABNORMAL HIGH (ref 0–35)
AST: 29 U/L (ref 0–37)
Albumin: 4 g/dL (ref 3.5–5.2)
Alkaline Phosphatase: 91 U/L (ref 39–117)
BUN: 10 mg/dL (ref 6–23)
Chloride: 107 mEq/L (ref 96–112)
Potassium: 4.1 mEq/L (ref 3.5–5.3)
Sodium: 141 mEq/L (ref 135–145)

## 2011-08-11 LAB — LIPID PANEL
HDL: 30 mg/dL — ABNORMAL LOW (ref >39)
LDL Cholesterol: 146 mg/dL — ABNORMAL HIGH (ref 0–99)

## 2011-08-17 ENCOUNTER — Telehealth: Payer: Self-pay | Admitting: Family Medicine

## 2011-08-17 DIAGNOSIS — I10 Essential (primary) hypertension: Secondary | ICD-10-CM

## 2011-08-17 NOTE — Telephone Encounter (Signed)
Wants to know about lab results?

## 2011-08-18 NOTE — Telephone Encounter (Signed)
Called again about results.

## 2011-08-19 ENCOUNTER — Encounter: Payer: Self-pay | Admitting: Family Medicine

## 2011-08-19 MED ORDER — LISINOPRIL 10 MG PO TABS: 10.0000 mg | ORAL_TABLET | Freq: Every day | ORAL | Status: DC

## 2011-08-19 NOTE — Telephone Encounter (Signed)
Talked to patient.  Reviewed results.  Start lisinopril today.  In 1 week, go for RN visit for BP check and BMET.

## 2011-09-14 ENCOUNTER — Ambulatory Visit: Payer: Medicaid Other | Admitting: Family Medicine

## 2011-09-18 ENCOUNTER — Ambulatory Visit (INDEPENDENT_AMBULATORY_CARE_PROVIDER_SITE_OTHER): Payer: Medicaid Other | Admitting: Family Medicine

## 2011-09-18 ENCOUNTER — Encounter: Payer: Self-pay | Admitting: Family Medicine

## 2011-09-18 VITALS — BP 142/91 | HR 76 | Temp 98.0°F | Ht 65.0 in | Wt 299.8 lb

## 2011-09-18 DIAGNOSIS — I10 Essential (primary) hypertension: Secondary | ICD-10-CM

## 2011-09-18 DIAGNOSIS — E669 Obesity, unspecified: Secondary | ICD-10-CM

## 2011-09-18 LAB — BASIC METABOLIC PANEL
CO2: 24 mEq/L (ref 19–32)
Calcium: 8.9 mg/dL (ref 8.4–10.5)
Potassium: 4.2 mEq/L (ref 3.5–5.3)
Sodium: 138 mEq/L (ref 135–145)

## 2011-09-18 MED ORDER — LISINOPRIL 20 MG PO TABS: 20.0000 mg | ORAL_TABLET | Freq: Every day | ORAL | Status: DC

## 2011-09-18 NOTE — Assessment & Plan Note (Signed)
BP Readings from Last 3 Encounters:  09/18/11 142/91  08/10/11 162/94  01/31/10 153/98   Blood pressure improved. Will increase lisinopril to 20 mg. See back in one month for recheck. Plan to see back in 2 months for a nutrition visit.

## 2011-09-18 NOTE — Assessment & Plan Note (Signed)
Lost 7 pounds. Working on diet and exercise changes. Encouraged her to continue that. See back in one month.

## 2011-09-18 NOTE — Progress Notes (Signed)
  Subjective:    Patient ID: Sherri Campbell, female    DOB: 02/10/1981, 31 y.o.   MRN: [id number]  HPI Obesity- pt working on walking more.  Has changed diet to limit red meat, fried foods.  She has lost 7 pounds since last visit. Denies any chest pain or shortness of breath with exertion.  Hypertension-patient started lisinopril. She denies dizziness, headache, vision change. She is not checking blood pressures at home. She is working on diet and exercise as above. She's not having any trouble taking the medicine and takes it daily.   nonsmoker  Review of Systems Denies CP, SOB, HA, N/V/D, fever     Objective:   Physical Exam Vital signs reviewed General appearance - alert, well appearing, and in no distress Heart - normal rate, regular rhythm, normal S1, S2, no murmurs, rubs, clicks or gallops Chest - clear to auscultation, no wheezes, rales or rhonchi, symmetric air entry, no tachypnea, retractions or cyanosis Extremities - peripheral pulses normal, no pedal edema, no clubbing or cyanosis        Assessment & Plan:

## 2011-09-18 NOTE — Patient Instructions (Signed)
Great job with the weight loss I would like to see you in one month to recheck your blood pressure  If you would like to, you can have a nutrition appt with me in June

## 2012-07-08 ENCOUNTER — Ambulatory Visit: Payer: Medicaid Other | Admitting: Family Medicine

## 2012-10-14 ENCOUNTER — Ambulatory Visit: Payer: Medicaid Other | Admitting: Family Medicine

## 2012-10-18 ENCOUNTER — Ambulatory Visit (INDEPENDENT_AMBULATORY_CARE_PROVIDER_SITE_OTHER): Payer: Medicaid Other | Admitting: Family Medicine

## 2012-10-18 ENCOUNTER — Ambulatory Visit (HOSPITAL_COMMUNITY)
Admission: RE | Admit: 2012-10-18 | Discharge: 2012-10-18 | Disposition: A | Discharge status: Home / Self Care | Payer: Medicaid Other | Source: Ambulatory Visit | Attending: Family Medicine | Admitting: Family Medicine

## 2012-10-18 ENCOUNTER — Encounter: Payer: Self-pay | Admitting: Family Medicine

## 2012-10-18 VITALS — BP 180/137 | HR 83 | Ht 70.0 in | Wt 314.0 lb

## 2012-10-18 DIAGNOSIS — R9431 Abnormal electrocardiogram [ECG] [EKG]: Secondary | ICD-10-CM | POA: Insufficient documentation

## 2012-10-18 DIAGNOSIS — R739 Hyperglycemia, unspecified: Secondary | ICD-10-CM

## 2012-10-18 DIAGNOSIS — R7309 Other abnormal glucose: Secondary | ICD-10-CM

## 2012-10-18 DIAGNOSIS — Z23 Encounter for immunization: Secondary | ICD-10-CM

## 2012-10-18 DIAGNOSIS — J309 Allergic rhinitis, unspecified: Secondary | ICD-10-CM

## 2012-10-18 DIAGNOSIS — R079 Chest pain, unspecified: Secondary | ICD-10-CM

## 2012-10-18 DIAGNOSIS — I10 Essential (primary) hypertension: Secondary | ICD-10-CM

## 2012-10-18 LAB — BASIC METABOLIC PANEL
CO2: 24 mEq/L (ref 19–32)
Chloride: 105 mEq/L (ref 96–112)
Creat: 0.71 mg/dL (ref 0.50–1.10)
Glucose, Bld: 108 mg/dL — ABNORMAL HIGH (ref 70–99)
Sodium: 136 mEq/L (ref 135–145)

## 2012-10-18 LAB — LIPID PANEL
HDL: 36 mg/dL — ABNORMAL LOW (ref >39)
LDL Cholesterol: 158 mg/dL — ABNORMAL HIGH (ref 0–99)
Total CHOL/HDL Ratio: 6.4 Ratio

## 2012-10-18 MED ORDER — LORATADINE 10 MG PO TABS: 10.0000 mg | ORAL_TABLET | Freq: Every day | ORAL | Status: DC

## 2012-10-18 MED ORDER — TETANUS-DIPHTH-ACELL PERTUSSIS 5-2.5-18.5 LF-MCG/0.5 IM SUSP: 0.5000 mL | Freq: Once | INTRAMUSCULAR | Status: DC

## 2012-10-18 MED ORDER — NITROGLYCERIN 0.4 MG SL SUBL: 0.4000 mg | SUBLINGUAL_TABLET | SUBLINGUAL | Status: DC | PRN

## 2012-10-18 MED ORDER — LISINOPRIL 20 MG PO TABS: 20.0000 mg | ORAL_TABLET | Freq: Every day | ORAL | Status: DC

## 2012-10-18 NOTE — Progress Notes (Signed)
Subjective:     Patient ID: Sherri Campbell, female   DOB: November 13, 1980, 32 y.o.   MRN: [id number]  CC - f/u BP  HPI [name] is a 32 y.o. female with h/o HTN, asthma, hyperglycemia here for BP follow-up.  1. HTN - Pt with ~10+ years of HTN on lisinopril, reports missing lisinopril 20mg  x 1 week due to running out. BP checked at [facility] last week was in 180s/100s so took a home remedy spoonful of vinegar. Reports headaches and stress due to daughter with multiple psychiatric issues and not listening to mom. Pt does not wear glasses so reports some blurred vision. Denies low blood pressures. Denies smoking.  2. Pt also reports chest pains when emotionally and physically stressed. Denies h/o heart disease but reports mother died from a heart attack at 46 years old. Pain is substernal with no radiation, is worse with stress and exercise and sometimes with meals, better with laying down or an alka-seltzer and when passes gas. When BP elevated and chest pain occurs, pt feels dizzy and sweaty. Symptoms x 2 months, lasting 30 minutes, with last episode yesterday when got upset, not currently in pain.  3. Reports ears feeling clogged since last week along with stuffy nose and decreased hearing left ear and mild sneeze when outdoors. Benadryl helps some but causes drowsiness. Denies fever/chills, ear pain, drainage, nasal drainage, eye symptoms. For asthma, uses albuterol 2-3 times per week with heat/outdoors being a trigger.  Review of Systems - Per HPI; otherwise, all systems reviewed and negative.  PMH:  Essential hypertension Asthma Obesity Allergic rhinitis Hyperglycemia  SH: Denies smoking FH: Reports h/o mother dying at 65 years old with heart attack.    Objective:   Physical Exam BP 180/137  Pulse 83  Ht 5\' 10"  (1.778 m)  Wt 314 lb (142.429 kg)  BMI 45.05 kg/m2 GEN: NAD, pleasant, obese female CV: RRR, no murmurs, rubs, or gallops PULM: CTAB, though distant, no wheezes or  crackles, normal effort ABD: Obese EXTR: No LE edema HEENT: TMs not visualized due to wax impaction bilaterally, no tenderness;  no erythema or exudate visualized  EKG: normal sinus rhythm, right superior axis deviation, T wave inversion I, II, V1, consider inferior ischemia    Assessment:     [name] is a 32 y.o. female with h/o HTN, asthma, hyperglycemia here for BP follow-up with reported recent h/o typical anginal symptoms and significant cardiovascular FH along with personal h/o uncontrolled HTN and hyperglycemia.    Plan:     # Health maintenance - TDAP today - F/u in 1 week for BP recheck and pap smear  # See problem list for problem-specific plan.

## 2012-10-18 NOTE — Patient Instructions (Addendum)
It was very nice to see you today Ms. Sherri Campbell.  For your ears, we are trying to wash them out today. If this does not help, let me know when you come back and we can try to soften your ear wax with a medicine. Also, try loratadine daily (if this is very expensive, ask the pharmacy if cetirizine would be cheaper).  For your blood pressure, I re-prescribed lisinopril.  Come back 1 week after restarting your lisinopril to recheck your blood pressure and get a pap smear with me. For your chest pain, it sounds like it could be related to gas but I am concerned that it happens when you are upset.  We are getting an EKG today. I want you to see a cardiologist. We are putting in the referral now. If you feel severe chest pain, try a nitroglycerin tablet under the tongue.  We are doing some labs today and I will call you if they are NOT normal. If you do not hear from me in 2 weeks or get a letter, call the office, in case we were just unable to reach you.  If you start feeling down or very stressed and would like to talk about it, let me know.  If you feel worse chest pain, shortness of breath, dizziness, or any other concerning symptoms, please go to the Emergency Room.

## 2012-10-19 ENCOUNTER — Encounter: Payer: Self-pay | Admitting: Family Medicine

## 2012-10-19 DIAGNOSIS — R079 Chest pain, unspecified: Secondary | ICD-10-CM | POA: Insufficient documentation

## 2012-10-19 NOTE — Assessment & Plan Note (Signed)
Congestion today likely due to allergies.  - Washed out ears today, pt reports improvement - Loratadine daily prescribed - If no improvement, flonase and debrox

## 2012-10-19 NOTE — Assessment & Plan Note (Signed)
-   Fasting BMET today, will order A1c if BG >100

## 2012-10-19 NOTE — Assessment & Plan Note (Addendum)
Chest pain sounds gastrointestinal but also has many features of typical angina. With significant cardiovascular FH and personal h/o uncontrolled HTN and hyperglycemia, pt needs further workup. No admission given no active current chest pain. - BMET (fasting BG) today, fasting lipid panel  - EKG with NSR, superior RAD, and Twave abnormalities - Restart lisinopril - F/u in 1 week after restarting lisinopril - Cardiology referral placed with hopes for them to see pt next week - Nitroglycerin SL tablet prn chest discomfort - Pending cardiology referral recs, consider stress test and benefit of statin, aspirin, beta-blocker - Return precautions per AVS

## 2012-10-19 NOTE — Assessment & Plan Note (Signed)
BP significantly elevated today and reportedly 1 week ago with pt with noncompliance.  - Reordered lisinopril 20mg  daily and emphasized importance of taking. - Follow-up in 1 week to recheck BP - BMET today

## 2012-10-20 ENCOUNTER — Telehealth: Payer: Self-pay | Admitting: Family Medicine

## 2012-10-20 ENCOUNTER — Encounter: Payer: Self-pay | Admitting: Family Medicine

## 2012-10-20 NOTE — Telephone Encounter (Signed)
Called and left message on patient's voicemail to call clinic. When patient calls, please let her know that blood sugar was mildly elevated and cholesterol was also a little high. I would like to get an A1c when I see her at f/u. I would also like to collaboratively discuss starting low-dose medication for cholesterol as this decision can be based on her preference. Though guidelines do not require she start medication now, I feel it may have greater benefit than harm with her family history of early cardiac-related death in her mother.   Simone Curia 10/20/2012 8:53 PM

## 2012-11-01 ENCOUNTER — Ambulatory Visit: Payer: Medicaid Other | Admitting: Family Medicine

## 2012-11-04 ENCOUNTER — Ambulatory Visit: Payer: Medicaid Other | Admitting: Family Medicine

## 2012-11-30 ENCOUNTER — Encounter: Payer: Self-pay | Admitting: Cardiovascular Disease

## 2012-11-30 ENCOUNTER — Ambulatory Visit (INDEPENDENT_AMBULATORY_CARE_PROVIDER_SITE_OTHER): Payer: Medicaid Other | Admitting: Cardiovascular Disease

## 2012-11-30 VITALS — BP 170/120 | HR 88 | Ht 69.0 in | Wt 317.0 lb

## 2012-11-30 DIAGNOSIS — I10 Essential (primary) hypertension: Secondary | ICD-10-CM

## 2012-11-30 DIAGNOSIS — R079 Chest pain, unspecified: Secondary | ICD-10-CM

## 2012-11-30 MED ORDER — HYDROCHLOROTHIAZIDE 25 MG PO TABS: 25.0000 mg | ORAL_TABLET | Freq: Every day | ORAL | Status: DC

## 2012-11-30 MED ORDER — POTASSIUM CHLORIDE CRYS ER 10 MEQ PO TBCR: 10.0000 meq | EXTENDED_RELEASE_TABLET | Freq: Every day | ORAL | Status: DC

## 2012-11-30 NOTE — Assessment & Plan Note (Signed)
He presents today with atypical chest pain. Resting EKG is normal. She typically has pain when she is stressed out after arguing with her daughter.  At this point I don't think that she needs any further specific evaluation. She is obese and therefore needs to lose weight. Her blood pressure is fairly elevated but she has not taken her blood pressure medications today. Reviewing the notes from her medical Dr. she occasionally has episodes of noncompliance.  She needs to have a GXT. We will have her get that through her primary medical doctor.  She continues to have episodes of chest pain with exercise I have asked her to call us back for further evaluation. Otherwise I will leave further decisions up to her medical Dr.

## 2012-11-30 NOTE — Assessment & Plan Note (Signed)
Her blood pressures markedly elevated today. She admits to not taking her blood pressure pill this point. We will add HCTZ 25 mg a day as well as potassium chloride 10 mg a day. I've given her instructions on a low salt diet. She is a lot of fast foods and very likely eats too much salt.  She'll follow with her primary medical doctor for basic metabolic profile in one month.  I've told her that she definitely needs to lose weight-  approximately 150 pounds.  I've given her information on the DASH diet as well as the [facility] Low glycemic diet.

## 2012-11-30 NOTE — Progress Notes (Signed)
     Sherri Campbell Date of Birth  1981-02-10       [facility]    [facility] [address]  [address] [place], [place]  [address]   [place], [place]  [address] [phone]     [phone]   Fax  [phone]    Fax [phone]  Problem List: 1. HTN 2. Chest pain 3. obesity   History of Present Illness:  Pt presents today for evaluation of episodes of CP - typically with mental stress - occasionally with exertion.  She has occasional episodes of dizziness.   She has not taken her BP meds today.  She does  regular exercise - she walks a fair amount.  ( lack of a car).   She does not get exercise with her normal work-outs.  Only gets CP if she overdoes it .    She eats a high salt diet - lots of fast foods.    Current Outpatient Prescriptions on File Prior to Visit  Medication Sig Dispense Refill  . albuterol (VENTOLIN HFA) 108 (90 BASE) MCG/ACT inhaler Inhale 1 puff into the lungs. Every 4-6 hours as needed for shortness of breath      . lisinopril (PRINIVIL,ZESTRIL) 20 MG tablet Take 1 tablet (20 mg total) by mouth daily.  90 tablet  0  . loratadine (CLARITIN) 10 MG tablet Take 1 tablet (10 mg total) by mouth daily.  90 tablet  3  . nitroGLYCERIN (NITROSTAT) 0.4 MG SL tablet Place 1 tablet (0.4 mg total) under the tongue every 5 (five) minutes as needed for chest pain.  20 tablet  0   No current facility-administered medications on file prior to visit.    No Known Allergies  No past medical history on file.  Past Surgical History  Procedure Laterality Date  . Tubal ligation      History  Smoking status  . Never Smoker   Smokeless tobacco  . Not on file    History  Alcohol Use: Not on file    Family History  Problem Relation Age of Onset  . Heart disease Mother     Reviw of Systems:  Reviewed in the HPI.  All other systems are negative.  Physical Exam: Blood pressure 170/120, pulse 88, height 5\' 9"  (1.753 m),  weight 317 lb (143.79 kg). General: Well developed, well nourished, in no acute distress.  obese  Head: Normocephalic, atraumatic, sclera non-icteric, mucus membranes are moist,   Neck: Supple. Carotids are 2 + without bruits. No JVD   Lungs: Clear   Heart: RR, normal S1, S2  Abdomen: Soft, non-tender, non-distended with normal bowel sounds.  Msk:  Strength and tone are normal   Extremities: No clubbing or cyanosis. No edema.  Distal pedal pulses are 2+ and equal    Neuro: CN II - XII intact.  Alert and oriented X 3.   Psych:  Normal   ECG: November 30, 2012:  NSR at 88, voltage for LVH  Assessment / Plan:

## 2012-11-30 NOTE — Patient Instructions (Addendum)
Your physician has recommended you make the following change in your medication:  START HCTZ 25 MG IN THE MORNING START POTASSIUM 25 MG DAILY WITH THE HCTZ  YOU NEED LAB WORK //BMET IN 1 MONTH TO CHECK YOUR KIDNEYS WITH THE NEW MED CHANGES. APPROX 12/30/12  [facility] Low Glycemic Diet (Source: [facility], 2006) Low Glycemic Foods (20-49) (Decrease risk of developing heart disease) Breakfast Cereals: All-Bran All-Bran Fruit 'n Oats Fiber One Oatmeal (not instant) Oat bran Fruits and fruit juices: (Limit to 1-2 servings per day) Apples Apricots (fresh & dried) Blackberries Blueberries Cherries Cranberries Peaches Pears Plums Prunes Grapefruit Raspberries Strawberries Tangerine Apple juice Grapefruit juice Tomato juice Beans and legumes (fresh-cooked): Black-eyed peas Butter beans Chick peas Lentils  Green beans Lima beans Kidney beans Navy beans Pinto beans Snow peas Non-starchy vegetables: Asparagus, avocado, broccoli, cabbage, cauliflower, celery, cucumber, greens, lettuce, mushrooms, peppers, tomatoes, okra, onions, spinach, summer squash Grains: Barley Bulgur Rye Wild rice Nuts and oils : Almonds Peanuts Sunflower seeds Hazelnuts Pecans Walnuts Oils that are liquid at room temperature Dairy, fish, meat, soy, and eggs: Milk, skim Lowfat cheese Yogurt, lowfat, fruit sugar sweetened Lean red meat Fish  Skinless chicken & [place] Shellfish Egg whites (up to 3 daily) Soy products  Egg yolks (up to 7 or _____ per week) Moderate Glycemic Foods (50-69) Breakfast Cereals: Bran Buds Bran Chex Just Right Mini-Wheats  Special K Swiss muesli Fruits: Banana (under-ripe) Dates Figs Grapes Kiwi Mango Oranges Raisins Fruit Juices: Cranberry juice Orange juice Beans and legumes: Boston-type baked beans Canned pinto, kidney, or navy beans Green peas Vegetables: Beets Carrots  Sweet potato Yam Corn on the cob Breads: Pita (pocket) bread Oat bran  bread Pumpernickel bread Rye bread Wheat bread, high fiber  Grains: Cornmeal Rice, brown Rice, white Couscous Pasta: Macaroni Pizza, cheese Ravioli, meat filled Spaghetti, white  Nuts: Cashews Macadamia Snacks: Chocolate Ice cream, lowfat Muffin Popcorn High Glycemic Foods (70-100)  Breakfast Cereals: Cheerios Corn Chex Corn Flakes Cream of Wheat Grape Nuts Grape Nut Flakes Grits Nutri-Grain Puffed Rice Puffed Wheat Rice Chex Rice Krispies Shredded Wheat Team Total Fruits: Pineapple Watermelon Banana (over-ripe) Beverages: Sodas, sweet tea, pineapple juice Vegetables: Potato, baked, boiled, fried, mashed Jamaica fries Canned or frozen corn Parsnips Winter squash Breads: Most breads (white and whole grain) Bagels Bread sticks Bread stuffing Kaiser roll Dinner rolls Grains: Rice, instant Tapioca, with milk Candy and most cookies Snacks: Donuts Corn chips Jelly beans Pretzels Pastries   REDUCE HIGH SODIUM FOODS LIKE CANNED SOUP, GRAVY, SAUCES, READY PREPARED FOODS LIKE FROZEN FOODS; LEAN CUISINE, LASAGNA. BACON, SAUSAGE, LUNCH MEAT, FAST FOODS..HOTDOGS AND CHIPS  DASH Diet The DASH diet stands for "Dietary Approaches to Stop Hypertension." It is a healthy eating plan that has been shown to reduce high blood pressure (hypertension) in as little as 14 days, while also possibly providing other significant health benefits. These other health benefits include reducing the risk of breast cancer after menopause and reducing the risk of type 2 diabetes, heart disease, colon cancer, and stroke. Health benefits also include weight loss and slowing kidney failure in patients with chronic kidney disease.  DIET GUIDELINES  Limit salt (sodium). Your diet should contain less than 1500 mg of sodium daily.  Limit refined or processed carbohydrates. Your diet should include mostly whole grains. Desserts and added sugars should be used sparingly.  Include small amounts of heart-healthy  fats. These types of fats include nuts, oils, and tub margarine. Limit saturated and trans  fats. These fats have been shown to be harmful in the body. CHOOSING FOODS  The following food groups are based on a 2000 calorie diet. See your Registered Dietitian for individual calorie needs. Grains and Grain Products (6 to 8 servings daily)  Eat More Often: Whole-wheat bread, brown rice, whole-grain or wheat pasta, quinoa, popcorn without added fat or salt (air popped).  Eat Less Often: White bread, white pasta, white rice, cornbread. Vegetables (4 to 5 servings daily)  Eat More Often: Fresh, frozen, and canned vegetables. Vegetables may be raw, steamed, roasted, or grilled with a minimal amount of fat.  Eat Less Often/Avoid: Creamed or fried vegetables. Vegetables in a cheese sauce. Fruit (4 to 5 servings daily)  Eat More Often: All fresh, canned (in natural juice), or frozen fruits. Dried fruits without added sugar. One hundred percent fruit juice ( cup [237 mL] daily).  Eat Less Often: Dried fruits with added sugar. Canned fruit in light or heavy syrup. [employer], Fish, and Poultry (2 servings or less daily. One serving is 3 to 4 oz [85-114 g]).  Eat More Often: Ninety percent or leaner ground beef, tenderloin, sirloin. Round cuts of beef, chicken breast, [place] breast. All fish. Grill, bake, or broil your meat. Nothing should be fried.  Eat Less Often/Avoid: Fatty cuts of meat, [place], or chicken leg, thigh, or wing. Fried cuts of meat or fish. Dairy (2 to 3 servings)  Eat More Often: Low-fat or fat-free milk, low-fat plain or light yogurt, reduced-fat or part-skim cheese.  Eat Less Often/Avoid: Milk (whole, 2%).Whole milk yogurt. Full-fat cheeses. Nuts, Seeds, and Legumes (4 to 5 servings per week)  Eat More Often: All without added salt.  Eat Less Often/Avoid: Salted nuts and seeds, canned beans with added salt. Fats and Sweets (limited)  Eat More Often: Vegetable oils, tub  margarines without trans fats, sugar-free gelatin. Mayonnaise and salad dressings.  Eat Less Often/Avoid: Coconut oils, palm oils, butter, stick margarine, cream, half and half, cookies, candy, pie. FOR MORE INFORMATION The Dash Diet Eating Plan: www.dashdiet.org Document Released: 05/14/2011 Document Revised: 08/17/2011 Document Reviewed: 05/14/2011 [facility] Patient Information 2014 [place], [place].  Your physician wants you to follow-up in: AS NEEDED

## 2012-12-13 ENCOUNTER — Telehealth: Payer: Self-pay | Admitting: Family Medicine

## 2012-12-13 DIAGNOSIS — I10 Essential (primary) hypertension: Secondary | ICD-10-CM

## 2012-12-13 DIAGNOSIS — R079 Chest pain, unspecified: Secondary | ICD-10-CM

## 2012-12-13 NOTE — Telephone Encounter (Signed)
Message copied by Leona Singleton on Tue Dec 13, 2012 10:50 AM ------      Message from: Vesta Mixer      Created: Wed Nov 30, 2012 12:01 PM       Byrd Hesselbach,            I saw Rilei today for consult.  I think it is unlikely that her CP is cardiac.  I have suggested that she get a regular GXT.  I think this could be done through your clinic.  If not, please call and schedule it with [place].            I have added HCTZ 25 and Kdur 10 for her HTN.        She will need a BMP in 1 month.  I have asked her to go to your lab to get this.            I have advised her to work on a good diet and exercise plan - she needs to lose about 150 lbs.  I do not think I need to see her on a regular basis and have asked her to see you in 1-2 months.            Thanks,            Phil       ------

## 2012-12-13 NOTE — Telephone Encounter (Addendum)
BLUE TEAM: I am ordering a BMET for Sherri Campbell for future lab to be done in about 1 month. Per Dr. Melburn Popper, she also needs a graded exercise stress test. Do we or [facility] do these and how do I arrange it? If not, I will let him know and she'll need it through cards. He has already spoken with her about needing this lab ordered but please call and ask her to make lab-only appt.  Sherri Singleton, MD

## 2012-12-14 NOTE — Telephone Encounter (Addendum)
What about Dr. Jennette Kettle - I thought she did these too - let me know and if she does not do them, I will message Dr. Melburn Popper (cards) to see if waiting till August is an option. Meant to route to [place], not Lupita Leash (apologies).

## 2012-12-14 NOTE — Telephone Encounter (Signed)
We can do either.  The problem is Dr. Perley Jain is out of state until August and Dr. Darrick Penna stays pretty booked @ [facility].  I think if you need it pretty soon the best bet is to let [facility] do it.  If you want the patient to be seen by Dr. Darrick Penna let me know and I will get it scheduled.  Fleeger, Maryjo Rochester

## 2012-12-15 NOTE — Telephone Encounter (Signed)
Sorry I forgot that Dr. Jennette Kettle did them.  Will forward to [facility] as these are scheduled in her [facility]. Fleeger, Maryjo Rochester

## 2012-12-18 NOTE — Telephone Encounter (Addendum)
It sounds like we are limited right now and will not be able to get Ms Munnerlyn in for exercise stress test in the near future. Dr. Elease Hashimoto, can your office go ahead and schedule it? [facility], please help facilitate this. Thanks! Leona Singleton, MD  12/18/2012 9:40 AM

## 2012-12-18 NOTE — Telephone Encounter (Signed)
Please schedule Sherri Campbell for GXT.

## 2012-12-19 NOTE — Telephone Encounter (Signed)
Order sent.

## 2012-12-21 ENCOUNTER — Encounter: Payer: Medicaid Other | Admitting: Physician Assistant

## 2012-12-28 ENCOUNTER — Telehealth: Payer: Self-pay | Admitting: Family Medicine

## 2012-12-28 NOTE — Telephone Encounter (Signed)
Blue Team, I see pt is scheduled for exercise stress test by cardiology but they are having difficulty reaching her by phone. Please try to call and let her know about this appt.  Thank you.  Leona Singleton, MD

## 2012-12-28 NOTE — Telephone Encounter (Signed)
Tried calling pt and unable to reach.  No way of leaving a message for her.  Looks like a letter with appt and instructions was already mailed to pt by cardiology.  Sherri Campbell,  Harlan Vinal

## 2013-01-05 ENCOUNTER — Encounter: Payer: Medicaid Other | Admitting: Physician Assistant

## 2013-02-01 ENCOUNTER — Encounter: Payer: Medicaid Other | Admitting: Physician Assistant

## 2013-02-07 ENCOUNTER — Telehealth: Payer: Self-pay | Admitting: *Deleted

## 2013-02-07 NOTE — Telephone Encounter (Signed)
msg left to call back and re schedule a missed app for GXT

## 2013-02-07 NOTE — Telephone Encounter (Signed)
Message copied by Antony Odea on Tue Feb 07, 2013  1:33 PM ------      Message from: Pricilla Holm      Created: Tue Feb 07, 2013 12:29 PM      Regarding: GXT       Patient was schedule for 02-01-13 and no showed for the appointment.       ----- Message -----         From: Antony Odea, RN         Sent: 02/07/2013  12:10 PM           To: Antony Odea, RN, [facility]            I sent an order for GTX, I do not see a test done, please review and let me know. Thank you, jodette       ------

## 2013-02-09 ENCOUNTER — Telehealth: Payer: Self-pay | Admitting: *Deleted

## 2013-02-09 NOTE — Telephone Encounter (Signed)
Message copied by Antony Odea on Thu Feb 09, 2013  4:02 PM ------      Message from: Mariane Masters D      Created: Tue Feb 07, 2013  4:02 PM       02/07/13 Patient cancel.      ----- Message -----         From: Antony Odea, RN         Sent: 02/07/2013  12:10 PM           To: Antony Odea, RN, [facility]            I sent an order for GTX, I do not see a test done, please review and let me know. Thank you, jodette       ------

## 2013-02-09 NOTE — Telephone Encounter (Signed)
msg left to call and reschedule, number provided.

## 2013-02-27 ENCOUNTER — Emergency Department (HOSPITAL_COMMUNITY)
Admission: EM | Admit: 2013-02-27 | Discharge: 2013-02-27 | Disposition: A | Discharge status: Home / Self Care | Payer: Medicaid Other | Source: Home / Self Care | Attending: Family Medicine | Admitting: Family Medicine

## 2013-02-27 ENCOUNTER — Encounter (HOSPITAL_COMMUNITY): Payer: Self-pay | Admitting: Emergency Medicine

## 2013-02-27 DIAGNOSIS — H6120 Impacted cerumen, unspecified ear: Secondary | ICD-10-CM

## 2013-02-27 DIAGNOSIS — H6122 Impacted cerumen, left ear: Secondary | ICD-10-CM

## 2013-02-27 MED ORDER — CIPROFLOXACIN-DEXAMETHASONE 0.3-0.1 % OT SUSP: 4.0000 [drp] | Freq: Two times a day (BID) | OTIC | Status: DC

## 2013-02-27 NOTE — ED Provider Notes (Signed)
Sherri Campbell is a 32 y.o. female who presents to Urgent Care today for left ear pain present since Saturday. Associated with some decreased hearing acuity. She feels well otherwise no nausea vomiting diarrhea fevers or chills. No trouble breathing chest pains palpitations   History reviewed. No pertinent past medical history. History  Substance Use Topics  . Smoking status: Never Smoker   . Smokeless tobacco: Not on file  . Alcohol Use: Not on file   ROS as above Medications reviewed. No current facility-administered medications for this encounter.   Current Outpatient Prescriptions  Medication Sig Dispense Refill  . albuterol (VENTOLIN HFA) 108 (90 BASE) MCG/ACT inhaler Inhale 1 puff into the lungs. Every 4-6 hours as needed for shortness of breath      . hydrochlorothiazide (HYDRODIURIL) 25 MG tablet Take 1 tablet (25 mg total) by mouth daily.  30 tablet  4  . lisinopril (PRINIVIL,ZESTRIL) 20 MG tablet Take 1 tablet (20 mg total) by mouth daily.  90 tablet  0  . loratadine (CLARITIN) 10 MG tablet Take 1 tablet (10 mg total) by mouth daily.  90 tablet  3  . nitroGLYCERIN (NITROSTAT) 0.4 MG SL tablet Place 1 tablet (0.4 mg total) under the tongue every 5 (five) minutes as needed for chest pain.  20 tablet  0  . potassium chloride (K-DUR,KLOR-CON) 10 MEQ tablet Take 1 tablet (10 mEq total) by mouth daily.  30 tablet  4    Exam:  BP 154/101  Pulse 86  Temp(Src) 98.9 F (37.2 C) (Oral)  Resp 20  SpO2 100% Gen: Well NAD HEENT: EOMI,  MMM left tympanic membranes occluded with cerumen. Right tympanic membrane is partially occluded cerumen but normal otherwise. Lungs: CTABL Nl WOB Heart: RRR no MRG   Following removal of the cerumen patient had considerable improvement of symptoms.   Assessment and Plan: 32 y.o. female with cerumen impaction. Will use Ciprodex drops as needed. Followup with primary care provider if not improving. Discussed warning signs or symptoms. Please see  discharge instructions. Patient expresses understanding.      Rodolph Bong, MD 02/27/13 [phone]

## 2013-02-27 NOTE — ED Notes (Signed)
C/o left ear pain since Saturday.  Patient states ear feels clogged up.

## 2013-05-08 ENCOUNTER — Encounter: Payer: Medicaid Other | Admitting: Family Medicine

## 2013-10-02 ENCOUNTER — Emergency Department (HOSPITAL_COMMUNITY)
Admission: EM | Admit: 2013-10-02 | Discharge: 2013-10-02 | Disposition: A | Discharge status: Home / Self Care | Payer: Medicaid Other | Attending: Emergency Medicine | Admitting: Emergency Medicine

## 2013-10-02 ENCOUNTER — Encounter (HOSPITAL_COMMUNITY): Payer: Self-pay | Admitting: Emergency Medicine

## 2013-10-02 DIAGNOSIS — M542 Cervicalgia: Secondary | ICD-10-CM | POA: Insufficient documentation

## 2013-10-02 DIAGNOSIS — Z79899 Other long term (current) drug therapy: Secondary | ICD-10-CM | POA: Insufficient documentation

## 2013-10-02 DIAGNOSIS — R112 Nausea with vomiting, unspecified: Secondary | ICD-10-CM | POA: Insufficient documentation

## 2013-10-02 DIAGNOSIS — I10 Essential (primary) hypertension: Secondary | ICD-10-CM | POA: Insufficient documentation

## 2013-10-02 DIAGNOSIS — Z760 Encounter for issue of repeat prescription: Secondary | ICD-10-CM | POA: Insufficient documentation

## 2013-10-02 DIAGNOSIS — Z791 Long term (current) use of non-steroidal anti-inflammatories (NSAID): Secondary | ICD-10-CM | POA: Insufficient documentation

## 2013-10-02 DIAGNOSIS — R519 Headache, unspecified: Secondary | ICD-10-CM

## 2013-10-02 DIAGNOSIS — Z8659 Personal history of other mental and behavioral disorders: Secondary | ICD-10-CM | POA: Insufficient documentation

## 2013-10-02 DIAGNOSIS — J45909 Unspecified asthma, uncomplicated: Secondary | ICD-10-CM | POA: Insufficient documentation

## 2013-10-02 DIAGNOSIS — R51 Headache: Secondary | ICD-10-CM | POA: Insufficient documentation

## 2013-10-02 HISTORY — DX: Unspecified asthma, uncomplicated: J45.909

## 2013-10-02 HISTORY — DX: Attention-deficit hyperactivity disorder, unspecified type: F90.9

## 2013-10-02 HISTORY — DX: Essential (primary) hypertension: I10

## 2013-10-02 MED ORDER — IBUPROFEN 800 MG PO TABS
800.0000 mg | ORAL_TABLET | Freq: Once | ORAL | Status: AC
  Administered 2013-10-02: 800 mg via ORAL
  Filled 2013-10-02: qty 1

## 2013-10-02 MED ORDER — LISINOPRIL 10 MG PO TABS: 20.0000 mg | ORAL_TABLET | Freq: Every day | ORAL | Status: DC

## 2013-10-02 MED ORDER — DIAZEPAM 5 MG PO TABS: 5.0000 mg | ORAL_TABLET | Freq: Two times a day (BID) | ORAL | Status: DC

## 2013-10-02 MED ORDER — DIAZEPAM 5 MG PO TABS
5.0000 mg | ORAL_TABLET | Freq: Once | ORAL | Status: AC
  Administered 2013-10-02: 5 mg via ORAL
  Filled 2013-10-02: qty 1

## 2013-10-02 MED ORDER — IBUPROFEN 800 MG PO TABS: 800.0000 mg | ORAL_TABLET | Freq: Three times a day (TID) | ORAL | Status: DC

## 2013-10-02 NOTE — Discharge Instructions (Signed)
Please follow up with your primary care physician in 1-2 days. If you do not have one please call the [facility] listed above. Please take pain medication and/or muscle relaxants as prescribed and as needed for pain. Please do not drive on narcotic pain medication or on muscle relaxants. Please restart your blood pressure medications and follow up with your family doctor to discuss your blood pressure. Please read all discharge instructions and return precautions.   Tension Headache A tension headache is a feeling of pain, pressure, or aching often felt over the front and sides of the head. The pain can be dull or can feel tight (constricting). It is the most common type of headache. Tension headaches are not normally associated with nausea or vomiting and do not get worse with physical activity. Tension headaches can last 30 minutes to several days.  CAUSES  The exact cause is not known, but it may be caused by chemicals and hormones in the brain that lead to pain. Tension headaches often begin after stress, anxiety, or depression. Other triggers may include:  Alcohol.  Caffeine (too much or withdrawal).  Respiratory infections (colds, flu, sinus infections).  Dental problems or teeth clenching.  Fatigue.  Holding your head and neck in one position too long while using a computer. SYMPTOMS   Pressure around the head.   Dull, aching head pain.   Pain felt over the front and sides of the head.   Tenderness in the muscles of the head, neck, and shoulders. DIAGNOSIS  A tension headache is often diagnosed based on:   Symptoms.   Physical examination.   A CT scan or MRI of your head. These tests may be ordered if symptoms are severe or unusual. TREATMENT  Medicines may be given to help relieve symptoms.  HOME CARE INSTRUCTIONS   Only take over-the-counter or prescription medicines for pain or discomfort as directed by your caregiver.   Lie down in  a dark, quiet room when you have a headache.   Keep a journal to find out what may be triggering your headaches. For example, write down:  What you eat and drink.  How much sleep you get.  Any change to your diet or medicines.  Try massage or other relaxation techniques.   Ice packs or heat applied to the head and neck can be used. Use these 3 to 4 times per day for 15 to 20 minutes each time, or as needed.   Limit stress.   Sit up straight, and do not tense your muscles.   Quit smoking if you smoke.  Limit alcohol use.  Decrease the amount of caffeine you drink, or stop drinking caffeine.  Eat and exercise regularly.  Get 7 to 9 hours of sleep, or as recommended by your caregiver.  Avoid excessive use of pain medicine as recurrent headaches can occur.  SEEK MEDICAL CARE IF:   You have problems with the medicines you were prescribed.  Your medicines do not work.  You have a change from the usual headache.  You have nausea or vomiting. SEEK IMMEDIATE MEDICAL CARE IF:   Your headache becomes severe.  You have a fever.  You have a stiff neck.  You have loss of vision.  You have muscular weakness or loss of muscle control.  You lose your balance or have trouble walking.  You feel faint or pass out.  You have severe symptoms that are different from your first symptoms. MAKE SURE YOU:  Understand these instructions.  Will watch your condition.  Will get help right away if you are not doing well or get worse. Document Released: 05/25/2005 Document Revised: 08/17/2011 Document Reviewed: 05/15/2011 [facility] Patient Information 2014 [place], [place].

## 2013-10-02 NOTE — ED Provider Notes (Signed)
CSN: [id number]     Arrival date & time 10/02/13  [id number] History   First MD Initiated Contact with Patient 10/02/13 0701     Chief Complaint  Patient presents with  . Headache     (Consider location/radiation/quality/duration/timing/severity/associated sxs/prior Treatment) HPI Comments: Patient is a 33 year old female past medical history significant for hypertension, asthma, ADHD presenting to the emergency department for one month of intermittent headaches. Patient states that she gets a shooting pain starting in her neck and the back of her head with associated nausea and vomiting this morning. She states laying down and closing her eyes and Motrin typically help alleviate her headaches. She denies any specific precipitating factor for her headache onset. She states palpation of her paraspinal muscles aggravate the pain occasionally. She denies any visual disturbance, photosensitivity, phono sensitivity. She states that she has had headaches similar to this back in December. Patient states she has not taken her blood pressure medications since December.  Patient is a 33 y.o. female presenting with headaches.  Headache Associated symptoms: nausea and vomiting   Associated symptoms: no fever, no neck stiffness and no numbness     Past Medical History  Diagnosis Date  . Hypertension   . Asthma   . ADHD (attention deficit hyperactivity disorder)    Past Surgical History  Procedure Laterality Date  . Tubal ligation    . Tonsillectomy     Family History  Problem Relation Age of Onset  . Heart disease Mother    History  Substance Use Topics  . Smoking status: Never Smoker   . Smokeless tobacco: Not on file  . Alcohol Use: Yes   OB History   Grav Para Term Preterm Abortions TAB SAB Ect Mult Living                 Review of Systems  Constitutional: Negative for fever and chills.  Gastrointestinal: Positive for nausea and vomiting.  Musculoskeletal: Negative for neck stiffness.    Neurological: Positive for headaches. Negative for syncope, weakness and numbness.  All other systems reviewed and are negative.     Allergies  Review of patient's allergies indicates no known allergies.  Home Medications   Prior to Admission medications   Medication Sig Start Date End Date Taking? Authorizing Provider  albuterol (VENTOLIN HFA) 108 (90 BASE) MCG/ACT inhaler Inhale 1 puff into the lungs. Every 4-6 hours as needed for shortness of breath   Yes Historical Provider, MD  ibuprofen (ADVIL,MOTRIN) 200 MG tablet Take 400 mg by mouth every 6 (six) hours as needed for headache, mild pain or moderate pain.   Yes Historical Provider, MD  nitroGLYCERIN (NITROSTAT) 0.4 MG SL tablet Place 1 tablet (0.4 mg total) under the tongue every 5 (five) minutes as needed for chest pain. 10/18/12   Leona SingletonMaria T Thekkekandam, MD   BP 165/113  Pulse 73  Temp(Src) 97.5 F (36.4 C) (Oral)  Ht 5\' 9"  (1.753 m)  Wt 260 lb (117.935 kg)  BMI 38.38 kg/m2  SpO2 99%  LMP 08/23/2013 Physical Exam  Nursing note and vitals reviewed. Constitutional: She is oriented to person, place, and time. She appears well-developed and well-nourished. No distress.  HENT:  Head: Normocephalic and atraumatic.  Right Ear: External ear normal.  Left Ear: External ear normal.  Nose: Nose normal.  Mouth/Throat: Oropharynx is clear and moist. No oropharyngeal exudate.  Eyes: Conjunctivae and EOM are normal. Pupils are equal, round, and reactive to light.  Neck: Normal range of motion. Neck supple.  Muscular tenderness present. No spinous process tenderness present. No rigidity. No edema, no erythema and normal range of motion present.    Cardiovascular: Normal rate, regular rhythm, normal heart sounds and intact distal pulses.   Pulmonary/Chest: Effort normal and breath sounds normal. No respiratory distress.  Abdominal: Soft. There is no tenderness.  Musculoskeletal: Normal range of motion.  Neurological: She is alert and  oriented to person, place, and time. She has normal strength. No cranial nerve deficit. Gait normal. GCS eye subscore is 4. GCS verbal subscore is 5. GCS motor subscore is 6.  Sensation grossly intact.  No pronator drift.  Bilateral heel-knee-shin intact.  Skin: Skin is warm and dry. She is not diaphoretic.  Psychiatric: Her speech is normal.    ED Course  Procedures (including critical care time) Medications  diazepam (VALIUM) tablet 5 mg (5 mg Oral Given 10/02/13 0737)  ibuprofen (ADVIL,MOTRIN) tablet 800 mg (800 mg Oral Given 10/02/13 0737)    Labs Review Labs Reviewed - No data to display  Imaging Review No results found.   EKG Interpretation None      MDM   Final diagnoses:  Headache  Medication refill    Filed Vitals:   10/02/13 0800  BP: 165/113  Pulse: 73  Temp:    Afebrile, NAD, non-toxic appearing, AAOx4. No neurofocal deficits. Pt HA treated and improved while in ED.  Presentation is non concerning for [facility], ICH, Meningitis, or temporal arteritis. Pt is afebrile with no focal neuro deficits, nuchal rigidity, or change in vision. No evidence of hypertensive urgency or emergency. Patient with muscle spasms to cervical spine muscles, likely causing symptoms. Restarted BP medications for patient. Advised PCP f/u. Patient is agreeable to plan. Patient is stable at time of discharge.        Jeannetta EllisJennifer L Avayah Raffety, PA-C 10/02/13 1528

## 2013-10-02 NOTE — ED Notes (Signed)
Patient presents to ED via POV. Patient states that she has "been under a lot of stress in the last week and developed a headache." Pt states that she has a hx of "high blood pressure" and has been out of bp medication since December. Pt c/o of n/v x3 this am. Denies any sensitivity to light or sound. No acute neuro deficits noted at this time. A&Ox4.

## 2013-10-03 NOTE — ED Provider Notes (Signed)
Medical screening examination/treatment/procedure(s) were performed by non-physician practitioner and as supervising physician I was immediately available for consultation/collaboration.   EKG Interpretation None        Myiah Petkus H Emmilynn Marut, MD 10/03/13 0701 

## 2013-10-09 ENCOUNTER — Ambulatory Visit (INDEPENDENT_AMBULATORY_CARE_PROVIDER_SITE_OTHER): Payer: Medicaid Other | Admitting: Emergency Medicine

## 2013-10-09 VITALS — BP 160/110 | HR 99 | Wt 297.0 lb

## 2013-10-09 DIAGNOSIS — J45909 Unspecified asthma, uncomplicated: Secondary | ICD-10-CM

## 2013-10-09 DIAGNOSIS — I10 Essential (primary) hypertension: Secondary | ICD-10-CM

## 2013-10-09 DIAGNOSIS — R112 Nausea with vomiting, unspecified: Secondary | ICD-10-CM

## 2013-10-09 DIAGNOSIS — J309 Allergic rhinitis, unspecified: Secondary | ICD-10-CM

## 2013-10-09 LAB — POCT URINE PREGNANCY: Preg Test, Ur: NEGATIVE

## 2013-10-09 MED ORDER — ALBUTEROL SULFATE HFA 108 (90 BASE) MCG/ACT IN AERS: 2.0000 | INHALATION_SPRAY | Freq: Four times a day (QID) | RESPIRATORY_TRACT | Status: DC | PRN

## 2013-10-09 MED ORDER — FLUTICASONE PROPIONATE 50 MCG/ACT NA SUSP: 2.0000 | Freq: Every day | NASAL | Status: DC

## 2013-10-09 MED ORDER — CETIRIZINE HCL 10 MG PO TABS: 10.0000 mg | ORAL_TABLET | Freq: Every day | ORAL | Status: DC

## 2013-10-09 MED ORDER — MONTELUKAST SODIUM 10 MG PO TABS: 10.0000 mg | ORAL_TABLET | Freq: Every day | ORAL | Status: DC

## 2013-10-09 NOTE — Assessment & Plan Note (Signed)
Was just restarted on lisinopril by the ED last week. She had not taken her meds this morning yet.

## 2013-10-09 NOTE — Progress Notes (Signed)
   Subjective:    Patient ID: Sherri Campbell, female    DOB: 10/11/1980, 33 y.o.   MRN: [id number]  HPI Sherri Campbell is here for a SDA for congestion.  She states that she has had a lot of nasal and chest congestion for the last 10 days. This is associated with a cough and sore throat. Reports feeling itchy everywhere on her body. No fevers. She has had some vomiting over the last week or so. This can occur after food or after a coughing episode. She states the emesis can be very mucousy or look like food.. She does have a history of asthma and allergic rhinitis. She states she is out of her albuterol inhaler at this time.  Current Outpatient Prescriptions on File Prior to Visit  Medication Sig Dispense Refill  . diazepam (VALIUM) 5 MG tablet Take 1 tablet (5 mg total) by mouth 2 (two) times daily.  10 tablet  0  . ibuprofen (ADVIL,MOTRIN) 200 MG tablet Take 400 mg by mouth every 6 (six) hours as needed for headache, mild pain or moderate pain.      Marland Kitchen. ibuprofen (ADVIL,MOTRIN) 800 MG tablet Take 1 tablet (800 mg total) by mouth 3 (three) times daily.  21 tablet  0  . lisinopril (PRINIVIL,ZESTRIL) 10 MG tablet Take 2 tablets (20 mg total) by mouth daily.  30 tablet  0  . nitroGLYCERIN (NITROSTAT) 0.4 MG SL tablet Place 1 tablet (0.4 mg total) under the tongue every 5 (five) minutes as needed for chest pain.  20 tablet  0   No current facility-administered medications on file prior to visit.    I have reviewed and updated the following as appropriate: allergies and current medications SHx: non smoker  Review of Systems See HPI    Objective:   Physical Exam BP 160/110  Pulse 99  Wt 297 lb (134.718 kg)  LMP 08/23/2013 Gen: alert, cooperative, NAD HEENT: AT/[place], sclera white, MMM, no pharyngeal erythema or exudate, no cobblestone; TMs normal bilaterally; nasal mucosa boggy with discharge present Neck: supple, no LAD CV: RRR, no murmurs Pulm: CTAB, no wheezes or rales Skin:  hyperpigmented patches with excoriations      Assessment & Plan:

## 2013-10-09 NOTE — Patient Instructions (Signed)
It was nice to meet you! You likely have a combination of allergies and a virus. Take Zyrtec 1 pill daily. Take Singulair 1 pill daily. Use Flonase 2 sprays each nostril daily.  You should start to feel better in the next 2-3 days.  If things are getting worse or you are not improving in the next week, please come back.

## 2013-10-09 NOTE — Assessment & Plan Note (Signed)
Likely also with viral component. No signs of bacterial infection. Vomiting likely secondary to congestion. Start zyrtec, singulair, flonase. Refilled albuterol F/u in 1 week if needed.

## 2013-10-26 ENCOUNTER — Encounter (HOSPITAL_COMMUNITY): Payer: Self-pay | Admitting: Emergency Medicine

## 2013-10-26 ENCOUNTER — Emergency Department (HOSPITAL_COMMUNITY)
Admission: EM | Admit: 2013-10-26 | Discharge: 2013-10-26 | Disposition: A | Discharge status: Home / Self Care | Payer: Medicaid Other | Attending: Emergency Medicine | Admitting: Emergency Medicine

## 2013-10-26 DIAGNOSIS — Z8659 Personal history of other mental and behavioral disorders: Secondary | ICD-10-CM | POA: Insufficient documentation

## 2013-10-26 DIAGNOSIS — J45909 Unspecified asthma, uncomplicated: Secondary | ICD-10-CM | POA: Insufficient documentation

## 2013-10-26 DIAGNOSIS — Y939 Activity, unspecified: Secondary | ICD-10-CM | POA: Insufficient documentation

## 2013-10-26 DIAGNOSIS — IMO0002 Reserved for concepts with insufficient information to code with codable children: Secondary | ICD-10-CM | POA: Insufficient documentation

## 2013-10-26 DIAGNOSIS — W57XXXA Bitten or stung by nonvenomous insect and other nonvenomous arthropods, initial encounter: Secondary | ICD-10-CM | POA: Insufficient documentation

## 2013-10-26 DIAGNOSIS — S40269A Insect bite (nonvenomous) of unspecified shoulder, initial encounter: Secondary | ICD-10-CM | POA: Insufficient documentation

## 2013-10-26 DIAGNOSIS — Z79899 Other long term (current) drug therapy: Secondary | ICD-10-CM | POA: Insufficient documentation

## 2013-10-26 DIAGNOSIS — Y929 Unspecified place or not applicable: Secondary | ICD-10-CM | POA: Insufficient documentation

## 2013-10-26 DIAGNOSIS — S90569A Insect bite (nonvenomous), unspecified ankle, initial encounter: Secondary | ICD-10-CM | POA: Insufficient documentation

## 2013-10-26 DIAGNOSIS — I10 Essential (primary) hypertension: Secondary | ICD-10-CM | POA: Insufficient documentation

## 2013-10-26 DIAGNOSIS — Z760 Encounter for issue of repeat prescription: Secondary | ICD-10-CM | POA: Insufficient documentation

## 2013-10-26 HISTORY — DX: Other seasonal allergic rhinitis: J30.2

## 2013-10-26 MED ORDER — PERMETHRIN 5 % EX CREA: TOPICAL_CREAM | CUTANEOUS | Status: DC

## 2013-10-26 MED ORDER — DIAZEPAM 5 MG PO TABS: 5.0000 mg | ORAL_TABLET | Freq: Two times a day (BID) | ORAL | Status: DC

## 2013-10-26 MED ORDER — LISINOPRIL 10 MG PO TABS: 10.0000 mg | ORAL_TABLET | Freq: Every day | ORAL | Status: DC

## 2013-10-26 NOTE — ED Notes (Addendum)
Pt states recently moved and was playing with her kids on the carpet a few days ago. Pt noticed bites/rash on her arms, stomach and ankles. Pt states that she took her child in and she had flea bites. Pt also needs a refill on medications BP and anxiety.
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
## 2013-10-26 NOTE — Discharge Instructions (Signed)
Take the prescribed medication as directed. Follow-up with your primary care physician. Wash all sheets, clothing, towels, linens, etc in hot water.  Recommend shampooing carpets to kill any remaining mites. Return to the ED for new or worsening symptoms.

## 2013-10-27 NOTE — ED Provider Notes (Signed)
Medical screening examination/treatment/procedure(s) were performed by non-physician practitioner and as supervising physician I was immediately available for consultation/collaboration.   EKG Interpretation None        Kashlyn Salinas, MD 10/27/13 0011 

## 2013-12-27 ENCOUNTER — Encounter: Payer: Medicaid Other | Admitting: Family Medicine

## 2014-01-18 ENCOUNTER — Encounter: Payer: Medicaid Other | Admitting: Family Medicine

## 2014-01-22 ENCOUNTER — Telehealth: Payer: Self-pay | Admitting: Family Medicine

## 2014-01-22 NOTE — Telephone Encounter (Signed)
Pt called and needs a refill and her BP medication. jw

## 2014-01-24 MED ORDER — LISINOPRIL 10 MG PO TABS: 10.0000 mg | ORAL_TABLET | Freq: Every day | ORAL | Status: DC

## 2014-01-24 NOTE — Telephone Encounter (Signed)
Please let her know I have refilled it. Thank you.  Sherri SingletonMaria T Jesten Cappuccio, MD

## 2014-01-24 NOTE — Telephone Encounter (Signed)
Pt is calling back to check the status of her refill request. jw °

## 2014-01-24 NOTE — Telephone Encounter (Signed)
Will forward to MD. Jazmin Hartsell,CMA  

## 2014-01-25 ENCOUNTER — Telehealth: Payer: Self-pay | Admitting: Family Medicine

## 2014-01-25 MED ORDER — LISINOPRIL 10 MG PO TABS
10.0000 mg | ORAL_TABLET | Freq: Every day | ORAL | Status: DC
Start: 2014-01-25 — End: 2014-08-01

## 2014-01-25 NOTE — Telephone Encounter (Signed)
Needs refill on lisinopril Uses [facility] [address]

## 2014-01-25 NOTE — Telephone Encounter (Signed)
Had done this yesterday but it looks like I printed instead of sending electronically. [facility]  Leona SingletonMaria T Natia Fahmy, MD

## 2014-01-25 NOTE — Telephone Encounter (Signed)
Pt informed. Fle[place], Sherri Campbell  

## 2014-02-20 ENCOUNTER — Encounter: Payer: Medicaid Other | Admitting: Family Medicine

## 2014-06-04 ENCOUNTER — Emergency Department (INDEPENDENT_AMBULATORY_CARE_PROVIDER_SITE_OTHER)
Admission: EM | Admit: 2014-06-04 | Discharge: 2014-06-04 | Disposition: A | Discharge status: Home / Self Care | Payer: PRIVATE HEALTH INSURANCE | Source: Home / Self Care | Attending: Family Medicine | Admitting: Family Medicine

## 2014-06-04 ENCOUNTER — Encounter (HOSPITAL_COMMUNITY): Payer: Self-pay

## 2014-06-04 DIAGNOSIS — M545 Low back pain, unspecified: Secondary | ICD-10-CM

## 2014-06-04 DIAGNOSIS — S46812A Strain of other muscles, fascia and tendons at shoulder and upper arm level, left arm, initial encounter: Secondary | ICD-10-CM

## 2014-06-04 MED ORDER — TRAMADOL HCL 50 MG PO TABS: 50.0000 mg | ORAL_TABLET | Freq: Four times a day (QID) | ORAL | Status: DC | PRN

## 2014-06-04 MED ORDER — DICLOFENAC POTASSIUM 50 MG PO TABS: 50.0000 mg | ORAL_TABLET | Freq: Three times a day (TID) | ORAL | Status: DC
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
## 2014-06-04 NOTE — Discharge Instructions (Signed)
Back Pain, Adult Back pain is very common. The pain often gets better over time. The cause of back pain is usually not dangerous. Most people can learn to manage their back pain on their own.  HOME CARE   Stay active. Start with short walks on flat ground if you can. Try to walk farther each day.  Do not sit, drive, or stand in one place for more than 30 minutes. Do not stay in bed.  Do not avoid exercise or work. Activity can help your back heal faster.  Be careful when you bend or lift an object. Bend at your knees, keep the object close to you, and do not twist.  Sleep on a firm mattress. Lie on your side, and bend your knees. If you lie on your back, put a pillow under your knees.  Only take medicines as told by your doctor.  Put ice on the injured area.  Put ice in a plastic bag.  Place a towel between your skin and the bag.  Leave the ice on for 15-20 minutes, 03-04 times a day for the first 2 to 3 days. After that, you can switch between ice and heat packs.  Ask your doctor about back exercises or massage.  Avoid feeling anxious or stressed. Find good ways to deal with stress, such as exercise. GET HELP RIGHT AWAY IF:   Your pain does not go away with rest or medicine.  Your pain does not go away in 1 week.  You have new problems.  You do not feel well.  The pain spreads into your legs.  You cannot control when you poop (bowel movement) or pee (urinate).  Your arms or legs feel weak or lose feeling (numbness).  You feel sick to your stomach (nauseous) or throw up (vomit).  You have belly (abdominal) pain.  You feel like you may pass out (faint). MAKE SURE YOU:   Understand these instructions.  Will watch your condition.  Will get help right away if you are not doing well or get worse. Document Released: 11/11/2007 Document Revised: 08/17/2011 Document Reviewed: 09/26/2013 [facility] Patient Information 2015 [place], [place]. This information is not intended  to replace advice given to you by your health care provider. Make sure you discuss any questions you have with your health care provider.  Lumbosacral Strain Lumbosacral strain is a strain of any of the parts that make up your lumbosacral vertebrae. Your lumbosacral vertebrae are the bones that make up the lower third of your backbone. Your lumbosacral vertebrae are held together by muscles and tough, fibrous tissue (ligaments).  CAUSES  A sudden blow to your back can cause lumbosacral strain. Also, anything that causes an excessive stretch of the muscles in the low back can cause this strain. This is typically seen when people exert themselves strenuously, fall, lift heavy objects, bend, or crouch repeatedly. RISK FACTORS  Physically demanding work.  Participation in pushing or pulling sports or sports that require a sudden twist of the back (tennis, golf, baseball).  Weight lifting.  Excessive lower back curvature.  Forward-tilted pelvis.  Weak back or abdominal muscles or both.  Tight hamstrings. SIGNS AND SYMPTOMS  Lumbosacral strain may cause pain in the area of your injury or pain that moves (radiates) down your leg.  DIAGNOSIS Your health care provider can often diagnose lumbosacral strain through a physical exam. In some cases, you may need tests such as X-ray exams.  TREATMENT  Treatment for your lower back injury depends  on many factors that your clinician will have to evaluate. However, most treatment will include the use of anti-inflammatory medicines. HOME CARE INSTRUCTIONS   Avoid hard physical activities (tennis, racquetball, waterskiing) if you are not in proper physical condition for it. This may aggravate or create problems.  If you have a back problem, avoid sports requiring sudden body movements. Swimming and walking are generally safer activities.  Maintain good posture.  Maintain a healthy weight.  For acute conditions, you may put ice on the injured  area.  Put ice in a plastic bag.  Place a towel between your skin and the bag.  Leave the ice on for 20 minutes, 2-3 times a day.  When the low back starts healing, stretching and strengthening exercises may be recommended. SEEK MEDICAL CARE IF:  Your back pain is getting worse.  You experience severe back pain not relieved with medicines. SEEK IMMEDIATE MEDICAL CARE IF:   You have numbness, tingling, weakness, or problems with the use of your arms or legs.  There is a change in bowel or bladder control.  You have increasing pain in any area of the body, including your belly (abdomen).  You notice shortness of breath, dizziness, or feel faint.  You feel sick to your stomach (nauseous), are throwing up (vomiting), or become sweaty.  You notice discoloration of your toes or legs, or your feet get very cold. MAKE SURE YOU:   Understand these instructions.  Will watch your condition.  Will get help right away if you are not doing well or get worse. Document Released: 03/04/2005 Document Revised: 05/30/2013 Document Reviewed: 01/11/2013 [facility] Patient Information 2015 [place]. This information is not intended to replace advice given to you by your health care provider. Make sure you discuss any questions you have with your health care provider.
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
## 2014-07-19 ENCOUNTER — Ambulatory Visit: Payer: Medicaid Other | Admitting: Family Medicine

## 2014-08-01 ENCOUNTER — Encounter: Payer: Self-pay | Admitting: Family Medicine

## 2014-08-01 ENCOUNTER — Ambulatory Visit (INDEPENDENT_AMBULATORY_CARE_PROVIDER_SITE_OTHER): Payer: Medicaid Other | Admitting: Family Medicine

## 2014-08-01 VITALS — BP 164/106 | HR 84 | Temp 98.3°F | Ht 70.0 in | Wt 303.0 lb

## 2014-08-01 DIAGNOSIS — I10 Essential (primary) hypertension: Secondary | ICD-10-CM

## 2014-08-01 LAB — COMPREHENSIVE METABOLIC PANEL
ALT: 35 U/L (ref 0–35)
AST: 22 U/L (ref 0–37)
Albumin: 4 g/dL (ref 3.5–5.2)
Alkaline Phosphatase: 91 U/L (ref 39–117)
BUN: 12 mg/dL (ref 6–23)
CALCIUM: 8.4 mg/dL (ref 8.4–10.5)
CO2: 23 meq/L (ref 19–32)
Chloride: 105 mEq/L (ref 96–112)
Creat: 0.69 mg/dL (ref 0.50–1.10)
Glucose, Bld: 87 mg/dL (ref 70–99)
POTASSIUM: 4.1 meq/L (ref 3.5–5.3)
Sodium: 139 mEq/L (ref 135–145)
Total Bilirubin: 0.2 mg/dL (ref 0.2–1.2)
Total Protein: 7.7 g/dL (ref 6.0–8.3)

## 2014-08-01 LAB — LIPID PANEL
Cholesterol: 202 mg/dL — ABNORMAL HIGH (ref 0–200)
HDL: 33 mg/dL — ABNORMAL LOW (ref >=46)
LDL CALC: 135 mg/dL — AB (ref 0–99)
TRIGLYCERIDES: 168 mg/dL — AB (ref <150)
Total CHOL/HDL Ratio: 6.1 Ratio
VLDL: 34 mg/dL (ref 0–40)

## 2014-08-01 MED ORDER — HYDROCHLOROTHIAZIDE 25 MG PO TABS: 25.0000 mg | ORAL_TABLET | Freq: Every day | ORAL | Status: DC

## 2014-08-01 MED ORDER — LISINOPRIL 10 MG PO TABS: 10.0000 mg | ORAL_TABLET | Freq: Every day | ORAL | Status: DC

## 2014-08-01 NOTE — Patient Instructions (Addendum)
Good to see you.  Your BP is still elevated. Restart your lisinopril and hydrochlorothiazide. Call me if these are ever close to running out again. We are getting labs and I will call if any are NOT normal. Work on diet and exercise. Cut sweet beverages down to <8 oz per day. Call your cardiologist since you are still having occasional chest tightness to follow up on the stress test scheduling. Check BP for me after restarting your medication and let me know what it is in 2 weeks. Come in 2 weeks for lab visit for metabolic panel recheck, and then see me 2 weeks later.  DASH Eating Plan DASH stands for "Dietary Approaches to Stop Hypertension." The DASH eating plan is a healthy eating plan that has been shown to reduce high blood pressure (hypertension). Additional health benefits may include reducing the risk of type 2 diabetes mellitus, heart disease, and stroke. The DASH eating plan may also help with weight loss. WHAT DO I NEED TO KNOW ABOUT THE DASH EATING PLAN? For the DASH eating plan, you will follow these general guidelines:  Choose foods with a percent daily value for sodium of less than 5% (as listed on the food label).  Use salt-free seasonings or herbs instead of table salt or sea salt.  Check with your health care provider or pharmacist before using salt substitutes.  Eat lower-sodium products, often labeled as "lower sodium" or "no salt added."  Eat fresh foods.  Eat more vegetables, fruits, and low-fat dairy products.  Choose whole grains. Look for the word "whole" as the first word in the ingredient list.  Choose fish and skinless chicken or [place] more often than red meat. Limit fish, poultry, and meat to 6 oz (170 g) each day.  Limit sweets, desserts, sugars, and sugary drinks.  Choose heart-healthy fats.  Limit cheese to 1 oz (28 g) per day.  Eat more home-cooked food and less restaurant, buffet, and fast food.  Limit fried foods.  Cook foods using methods  other than frying.  Limit canned vegetables. If you do use them, rinse them well to decrease the sodium.  When eating at a restaurant, ask that your food be prepared with less salt, or no salt if possible. WHAT FOODS CAN I EAT? Seek help from a dietitian for individual calorie needs. Grains Whole grain or whole wheat bread. Brown rice. Whole grain or whole wheat pasta. Quinoa, bulgur, and whole grain cereals. Low-sodium cereals. Corn or whole wheat flour tortillas. Whole grain cornbread. Whole grain crackers. Low-sodium crackers. Vegetables Fresh or frozen vegetables (raw, steamed, roasted, or grilled). Low-sodium or reduced-sodium tomato and vegetable juices. Low-sodium or reduced-sodium tomato sauce and paste. Low-sodium or reduced-sodium canned vegetables.  Fruits All fresh, canned (in natural juice), or frozen fruits. Meat and Other Protein Products Ground beef (85% or leaner), grass-fed beef, or beef trimmed of fat. Skinless chicken or [place]. Ground chicken or [place]. Pork trimmed of fat. All fish and seafood. Eggs. Dried beans, peas, or lentils. Unsalted nuts and seeds. Unsalted canned beans. Dairy Low-fat dairy products, such as skim or 1% milk, 2% or reduced-fat cheeses, low-fat ricotta or cottage cheese, or plain low-fat yogurt. Low-sodium or reduced-sodium cheeses. Fats and Oils Tub margarines without trans fats. Light or reduced-fat mayonnaise and salad dressings (reduced sodium). Avocado. Safflower, olive, or canola oils. Natural peanut or almond butter. Other Unsalted popcorn and pretzels. The items listed above may not be a complete list of recommended foods or beverages. Contact your dietitian  for more options. WHAT FOODS ARE NOT RECOMMENDED? Grains White bread. White pasta. White rice. Refined cornbread. Bagels and croissants. Crackers that contain trans fat. Vegetables Creamed or fried vegetables. Vegetables in a cheese sauce. Regular canned vegetables. Regular canned  tomato sauce and paste. Regular tomato and vegetable juices. Fruits Dried fruits. Canned fruit in light or heavy syrup. Fruit juice. Meat and Other Protein Products Fatty cuts of meat. Ribs, chicken wings, bacon, sausage, bologna, salami, chitterlings, fatback, hot dogs, bratwurst, and packaged luncheon meats. Salted nuts and seeds. Canned beans with salt. Dairy Whole or 2% milk, cream, half-and-half, and cream cheese. Whole-fat or sweetened yogurt. Full-fat cheeses or blue cheese. Nondairy creamers and whipped toppings. Processed cheese, cheese spreads, or cheese curds. Condiments Onion and garlic salt, seasoned salt, table salt, and sea salt. Canned and packaged gravies. Worcestershire sauce. Tartar sauce. Barbecue sauce. Teriyaki sauce. Soy sauce, including reduced sodium. Steak sauce. Fish sauce. Oyster sauce. Cocktail sauce. Horseradish. Ketchup and mustard. Meat flavorings and tenderizers. Bouillon cubes. Hot sauce. Tabasco sauce. Marinades. Taco seasonings. Relishes. Fats and Oils Butter, stick margarine, lard, shortening, ghee, and bacon fat. Coconut, palm kernel, or palm oils. Regular salad dressings. Other Pickles and olives. Salted popcorn and pretzels. The items listed above may not be a complete list of foods and beverages to avoid. Contact your dietitian for more information. WHERE CAN I FIND MORE INFORMATION? National Heart, Lung, and Blood Institute: [employer].itwww.nhlbi.nih.gov/health/health-topics/topics/dash/ Document Released: 05/14/2011 Document Revised: 10/09/2013 Document Reviewed: 03/29/2013 [facility] Patient Information 2015 [place], [place]. This information is not intended to replace advice given to you by your health care provider. Make sure you discuss any questions you have with your health care provider.

## 2014-08-01 NOTE — Progress Notes (Signed)
Patient ID: Sherri Campbell, female   DOB: 02/03/1981, 34 y.o.   MRN: [id number] Subjective:   CC: Follow-up hypertension  HPI:   This is a 34 year old female presenting for follow-up of hypertension. She is supposed to be taking lisinopril and hydrochlorothiazide but has been out for one month. Some exertional chest tightness 2/8, similar to chronic chest pain, relieved with rest. Denies dizziness or syncope, nausea, diaphoresis or vomiting. Shortnress of breath at baseline.  Some leg swelling lately. Some headaches more than normal that go into neck. Diet: Has been eating more baked foods but occasional fried chicken. "a whole lot" of sweet beverages.    Review of Systems - Per HPI.   PMH - obesity, HTN, hirsutism and absence of menstruation, h/o gestational DM, asthma, allergic rhinitis, hyperglycemia, h/o chest pain.    Objective:  Physical Exam BP 164/106 mmHg  Pulse 84  Temp(Src) 98.3 F (36.8 C) (Oral)  Ht 5\' 10"  (1.778 m)  Wt 303 lb (137.44 kg)  BMI 43.48 kg/m2  LMP 07/01/2014 GEN: NAD CV: RRR, no m/r/g PULM: Normal effort EXTR: Trace LE bilateral edema    Assessment:     Sherri Campbell is a 34 y.o. female here for f/u of HTN.    Plan:     # See problem list and after visit summary for problem-specific plans.   Follow-up: Follow up in 4weeks for HTN f/u and DM f/u.   Sherri SingletonMaria T Deaunte Dente, MD [facility]

## 2014-08-02 ENCOUNTER — Other Ambulatory Visit: Payer: Self-pay | Admitting: Family Medicine

## 2014-08-02 NOTE — Assessment & Plan Note (Signed)
Blood pressure markedly elevated after being out of medications one month. Intermittent chest tightness that is chronic. Last creatinine 10/2012 0.71. A1c 5.9 08/2011. - Refilled lisinopril and hydrochlorothiazide. Encouraged notifying prior to running out. - lipid panel, bmet today  - work on diet and exercise for weight loss. DASH diet info given. - Asked patient to get in touch with cardiology for regular follow-up due to continued chest pain. Had been seen for this 11/2012 and plan was for exercise treadmill test that cardiology ordered but was not set up - check blood pressure after restarting medication and call with this value in 2 weeks. - Lab appointment for repeat BMET in 2 weeks, Follow up with me in 4 weeks.

## 2014-08-03 NOTE — Progress Notes (Signed)
Reviewed

## 2014-08-11 ENCOUNTER — Telehealth: Payer: Self-pay | Admitting: Family Medicine

## 2014-08-11 DIAGNOSIS — E785 Hyperlipidemia, unspecified: Secondary | ICD-10-CM

## 2014-08-11 NOTE — Telephone Encounter (Addendum)
Left message on voice mail. When patient calls, please let her know the following:  CMET normal.   Lipid panel - Would start moderate or high intensity statin (atorvastatin or rosuvastatin) and recheck lipids in 1 year based on cholesterol and other risk factors for heart attack or stroke. Side effects are rare (muscle aches most commonly). Would she like to start this medication? I recommend it if she is amenable, along with seeing me about diet/exercise for weight loss. She can contact us if medication too expensive but it's usually covered by insurance. Documentation: 10 year ASCVD risk uncalculable due to age <40. However, if bump age up to 40, risk jumps to 18.4%. H/o gestational DM, so if put "diabetic", risk jumps to 38.6%.    Sherri SingletonMaria T Lael Wetherbee, MD

## 2014-08-13 MED ORDER — ATORVASTATIN CALCIUM 40 MG PO TABS: 40.0000 mg | ORAL_TABLET | Freq: Every day | ORAL | Status: DC

## 2014-08-13 NOTE — Telephone Encounter (Signed)
Atorvastatin 40mg  daily sent to pharmacy.  Sherri SingletonMaria T Baylin Cabal, MD

## 2014-08-13 NOTE — Addendum Note (Signed)
Addended by: Simone CuriaHEKKEKANDAM, Adamae Ricklefs T on: 08/13/2014 06:35 PM   Modules accepted: Orders

## 2014-08-13 NOTE — Telephone Encounter (Signed)
Pt is aware of results and is fine with starting medication.  Pharmacy verified in epic.  Pt will also call back to make an appt to discuss diet and weight loss. Jazmin Hartsell,CMA

## 2015-02-18 ENCOUNTER — Ambulatory Visit (INDEPENDENT_AMBULATORY_CARE_PROVIDER_SITE_OTHER): Payer: Medicaid Other | Admitting: Obstetrics and Gynecology

## 2015-02-18 ENCOUNTER — Encounter: Payer: Self-pay | Admitting: Obstetrics and Gynecology

## 2015-02-18 VITALS — BP 148/104 | HR 89 | Temp 98.2°F | Ht 69.0 in | Wt 313.0 lb

## 2015-02-18 DIAGNOSIS — E669 Obesity, unspecified: Secondary | ICD-10-CM

## 2015-02-18 DIAGNOSIS — Z01419 Encounter for gynecological examination (general) (routine) without abnormal findings: Secondary | ICD-10-CM | POA: Insufficient documentation

## 2015-02-18 DIAGNOSIS — I10 Essential (primary) hypertension: Secondary | ICD-10-CM | POA: Diagnosis present

## 2015-02-18 DIAGNOSIS — N926 Irregular menstruation, unspecified: Secondary | ICD-10-CM | POA: Diagnosis not present

## 2015-02-18 DIAGNOSIS — E282 Polycystic ovarian syndrome: Secondary | ICD-10-CM | POA: Insufficient documentation

## 2015-02-18 DIAGNOSIS — Z0000 Encounter for general adult medical examination without abnormal findings: Secondary | ICD-10-CM

## 2015-02-18 DIAGNOSIS — Z23 Encounter for immunization: Secondary | ICD-10-CM

## 2015-02-18 LAB — POCT GLYCOSYLATED HEMOGLOBIN (HGB A1C): Hemoglobin A1C: 6.1

## 2015-02-18 MED ORDER — ALBUTEROL SULFATE HFA 108 (90 BASE) MCG/ACT IN AERS: 2.0000 | INHALATION_SPRAY | Freq: Four times a day (QID) | RESPIRATORY_TRACT | Status: DC | PRN

## 2015-02-18 MED ORDER — HYDROCHLOROTHIAZIDE 25 MG PO TABS: 25.0000 mg | ORAL_TABLET | Freq: Every day | ORAL | Status: DC

## 2015-02-18 MED ORDER — LISINOPRIL 10 MG PO TABS: 10.0000 mg | ORAL_TABLET | Freq: Every day | ORAL | Status: DC

## 2015-02-18 MED ORDER — MONTELUKAST SODIUM 10 MG PO TABS: 10.0000 mg | ORAL_TABLET | Freq: Every day | ORAL | Status: DC

## 2015-02-18 MED ORDER — ATORVASTATIN CALCIUM 40 MG PO TABS: 40.0000 mg | ORAL_TABLET | Freq: Every day | ORAL | Status: DC

## 2015-02-18 NOTE — Patient Instructions (Addendum)
Please schedule an appointment in one month for pap smear and further evaluation of bleeding I will contact you about your lab work Medication refills given; please go home and take your BP medications  Health Maintenance Adopting a healthy lifestyle and getting preventive care can go a long way to promote health and wellness. Talk with your health care provider about what schedule of regular examinations is right for you. This is a good chance for you to check in with your provider about disease prevention and staying healthy. In between checkups, there are plenty of things you can do on your own. Experts have done a lot of research about which lifestyle changes and preventive measures are most likely to keep you healthy. Ask your health care provider for more information. WEIGHT AND DIET  Eat a healthy diet  Be sure to include plenty of vegetables, fruits, low-fat dairy products, and lean protein.  Do not eat a lot of foods high in solid fats, added sugars, or salt.  Get regular exercise. This is one of the most important things you can do for your health.  Most adults should exercise for at least 150 minutes each week. The exercise should increase your heart rate and make you sweat (moderate-intensity exercise).  Most adults should also do strengthening exercises at least twice a week. This is in addition to the moderate-intensity exercise.  Maintain a healthy weight  Body mass index (BMI) is a measurement that can be used to identify possible weight problems. It estimates body fat based on height and weight. Your health care provider can help determine your BMI and help you achieve or maintain a healthy weight.  For females 34 years of age and older:   A BMI below 18.5 is considered underweight.  A BMI of 18.5 to 24.9 is normal.  A BMI of 25 to 29.9 is considered overweight.  A BMI of 30 and above is considered obese.  Watch levels of cholesterol and blood lipids  You should  start having your blood tested for lipids and cholesterol at 34 years of age, then have this test every 5 years.  You may need to have your cholesterol levels checked more often if:  Your lipid or cholesterol levels are high.  You are older than 34 years of age.  You are at high risk for heart disease.  CANCER SCREENING   Lung Cancer  Lung cancer screening is recommended for adults 53-23 years old who are at high risk for lung cancer because of a history of smoking.  A yearly low-dose CT scan of the lungs is recommended for people who:  Currently smoke.  Have quit within the past 15 years.  Have at least a 30-pack-year history of smoking. A pack year is smoking an average of one pack of cigarettes a day for 1 year.  Yearly screening should continue until it has been 15 years since you quit.  Yearly screening should stop if you develop a health problem that would prevent you from having lung cancer treatment.  Breast Cancer  Practice breast self-awareness. This means understanding how your breasts normally appear and feel.  It also means doing regular breast self-exams. Let your health care provider know about any changes, no matter how small.  If you are in your 20s or 30s, you should have a clinical breast exam (CBE) by a health care provider every 1-3 years as part of a regular health exam.  If you are 41 or older, have a  CBE every year. Also consider having a breast X-ray (mammogram) every year.  If you have a family history of breast cancer, talk to your health care provider about genetic screening.  If you are at high risk for breast cancer, talk to your health care provider about having an MRI and a mammogram every year.  Breast cancer gene (BRCA) assessment is recommended for women who have family members with BRCA-related cancers. BRCA-related cancers include:  Breast.  Ovarian.  Tubal.  Peritoneal cancers.  Results of the assessment will determine the  need for genetic counseling and BRCA1 and BRCA2 testing. Cervical Cancer Routine pelvic examinations to screen for cervical cancer are no longer recommended for nonpregnant women who are considered low risk for cancer of the pelvic organs (ovaries, uterus, and vagina) and who do not have symptoms. A pelvic examination may be necessary if you have symptoms including those associated with pelvic infections. Ask your health care provider if a screening pelvic exam is right for you.   The Pap test is the screening test for cervical cancer for women who are considered at risk.  If you had a hysterectomy for a problem that was not cancer or a condition that could lead to cancer, then you no longer need Pap tests.  If you are older than 65 years, and you have had normal Pap tests for the past 10 years, you no longer need to have Pap tests.  If you have had past treatment for cervical cancer or a condition that could lead to cancer, you need Pap tests and screening for cancer for at least 20 years after your treatment.  If you no longer get a Pap test, assess your risk factors if they change (such as having a new sexual partner). This can affect whether you should start being screened again.  Some women have medical problems that increase their chance of getting cervical cancer. If this is the case for you, your health care provider may recommend more frequent screening and Pap tests.  The human papillomavirus (HPV) test is another test that may be used for cervical cancer screening. The HPV test looks for the virus that can cause cell changes in the cervix. The cells collected during the Pap test can be tested for HPV.  The HPV test can be used to screen women 87 years of age and older. Getting tested for HPV can extend the interval between normal Pap tests from three to five years.  An HPV test also should be used to screen women of any age who have unclear Pap test results.  After 34 years of age,  women should have HPV testing as often as Pap tests.  Colorectal Cancer  This type of cancer can be detected and often prevented.  Routine colorectal cancer screening usually begins at 34 years of age and continues through 34 years of age.  Your health care provider may recommend screening at an earlier age if you have risk factors for colon cancer.  Your health care provider may also recommend using home test kits to check for hidden blood in the stool.  A small camera at the end of a tube can be used to examine your colon directly (sigmoidoscopy or colonoscopy). This is done to check for the earliest forms of colorectal cancer.  Routine screening usually begins at age 39.  Direct examination of the colon should be repeated every 5-10 years through 34 years of age. However, you may need to be screened more often  often if early forms of precancerous polyps or small growths are found. Skin Cancer  Check your skin from head to toe regularly.  Tell your health care provider about any new moles or changes in moles, especially if there is a change in a mole's shape or color.  Also tell your health care provider if you have a mole that is larger than the size of a pencil eraser.  Always use sunscreen. Apply sunscreen liberally and repeatedly throughout the day.  Protect yourself by wearing long sleeves, pants, a wide-brimmed hat, and sunglasses whenever you are outside. HEART DISEASE, DIABETES, AND HIGH BLOOD PRESSURE   Have your blood pressure checked at least every 1-2 years. High blood pressure causes heart disease and increases the risk of stroke.  If you are between 55 years and 79 years old, ask your health care provider if you should take aspirin to prevent strokes.  Have regular diabetes screenings. This involves taking a blood sample to check your fasting blood sugar level.  If you are at a normal weight and have a low risk for diabetes, have this test once every three years after 34  years of age.  If you are overweight and have a high risk for diabetes, consider being tested at a younger age or more often. PREVENTING INFECTION  Hepatitis B  If you have a higher risk for hepatitis B, you should be screened for this virus. You are considered at high risk for hepatitis B if:  You were born in a country where hepatitis B is common. Ask your health care provider which countries are considered high risk.  Your parents were born in a high-risk country, and you have not been immunized against hepatitis B (hepatitis B vaccine).  You have HIV or AIDS.  You use needles to inject street drugs.  You live with someone who has hepatitis B.  You have had sex with someone who has hepatitis B.  You get hemodialysis treatment.  You take certain medicines for conditions, including cancer, organ transplantation, and autoimmune conditions. Hepatitis C  Blood testing is recommended for:  Everyone born from 1945 through 1965.  Anyone with known risk factors for hepatitis C. Sexually transmitted infections (STIs)  You should be screened for sexually transmitted infections (STIs) including gonorrhea and chlamydia if:  You are sexually active and are younger than 34 years of age.  You are older than 34 years of age and your health care provider tells you that you are at risk for this type of infection.  Your sexual activity has changed since you were last screened and you are at an increased risk for chlamydia or gonorrhea. Ask your health care provider if you are at risk.  If you do not have HIV, but are at risk, it may be recommended that you take a prescription medicine daily to prevent HIV infection. This is called pre-exposure prophylaxis (PrEP). You are considered at risk if:  You are sexually active and do not regularly use condoms or know the HIV status of your partner(s).  You take drugs by injection.  You are sexually active with a partner who has HIV. Talk with  your health care provider about whether you are at high risk of being infected with HIV. If you choose to begin PrEP, you should first be tested for HIV. You should then be tested every 3 months for as long as you are taking PrEP.  PREGNANCY   If you are premenopausal and you may become   ask your health care provider about preconception counseling.  If you may become pregnant, take 400 to 800 micrograms (mcg) of folic acid every day.  If you want to prevent pregnancy, talk to your health care provider about birth control (contraception). OSTEOPOROSIS AND MENOPAUSE   Osteoporosis is a disease in which the bones lose minerals and strength with aging. This can result in serious bone fractures. Your risk for osteoporosis can be identified using a bone density scan.  If you are 62 years of age or older, or if you are at risk for osteoporosis and fractures, ask your health care provider if you should be screened.  Ask your health care provider whether you should take a calcium or vitamin D supplement to lower your risk for osteoporosis.  Menopause may have certain physical symptoms and risks.  Hormone replacement therapy may reduce some of these symptoms and risks. Talk to your health care provider about whether hormone replacement therapy is right for you.  HOME CARE INSTRUCTIONS   Schedule regular health, dental, and eye exams.  Stay current with your immunizations.   Do not use any tobacco products including cigarettes, chewing tobacco, or electronic cigarettes.  If you are pregnant, do not drink alcohol.  If you are breastfeeding, limit how much and how often you drink alcohol.  Limit alcohol intake to no more than 1 drink per day for nonpregnant women. One drink equals 12 ounces of beer, 5 ounces of wine, or 1 ounces of hard liquor.  Do not use street drugs.  Do not share needles.  Ask your health care provider for help if you need support or information about quitting  drugs.  Tell your health care provider if you often feel depressed.  Tell your health care provider if you have ever been abused or do not feel safe at home. Document Released: 12/08/2010 Document Revised: 10/09/2013 Document Reviewed: 04/26/2013 [facility] Patient Information 2015 [place], [place]. This information is not intended to replace advice given to you by your health care provider. Make sure you discuss any questions you have with your health care provider.

## 2015-02-18 NOTE — Assessment & Plan Note (Signed)
Most likely 2/2 PCOS and endometrial hyperplasia causing prolonged bleeding and irregular menstration. Discussed options with patient and starting medical treatment. She is to RTC in 3-4 weeks for further discussion after menstruation.

## 2015-02-18 NOTE — Assessment & Plan Note (Signed)
BP elevated today. Patient has not taken any of her medications. -Refills given for Lisinopril and HCTZ.  -RTC in 3-4 weeks for recheck of BP on medications

## 2015-02-18 NOTE — Assessment & Plan Note (Signed)
Presumed diagnosis based on clinical presentation. Patient is obese, hirsutism, HTN, HLD, irregular menses and known history of prior ovarian cyst requiring treatment. Will continue to treat medically. Check A1c today as pt has h/o GDM. Encouraged lifestyle modifications. Dicussed possibility of starting OCPs to help better manage side effects.

## 2015-02-18 NOTE — Assessment & Plan Note (Signed)
Pap smear not performed today due to menstruation. Patient to return to clinic for gyn exam. She received flu vaccine today. All other health maintenance items are up to date.

## 2015-02-18 NOTE — Progress Notes (Signed)
34 y.o. year old female presents for well woman/preventative visit and annual GYN examination.  Acute Concerns:  #On menstrual period currently. Patient wants to postpone pap smear today. States that she gets bad cramps with menstrual period (severity 8/10).  Menstrual cycle started yesterday. States that it has always been irregular with periods coming every 1-3 months and heavy bleeding for about 14 days. She has never been worked up for this.   #HTN: BP elevated today. Patient states she has not taking any of her medications today.  Needs refills on medications.   Diet:  Trying to eat healthy; junk food in house; does consume some fruits and vegatables. Tries to prepare all her meals.   Exercise: Minimal; tries to walk every weekend  Sexual/Birth History: Sexually active with one partner  Birth Control: No birth control. Has had tubal ligation.  POA/Living Will: None  Social:  Social History   Social History  . Marital Status: Single    Spouse Name: N/A  . Number of Children: N/A  . Years of Education: N/A   Social History Main Topics  . Smoking status: Never Smoker   . Smokeless tobacco: None  . Alcohol Use: Yes  . Drug Use: No  . Sexual Activity: Not Asked   Other Topics Concern  . None   Social History Narrative    Immunization:  Tdap/TD: Up to date  Influenza: Due; patient wants vaccine today   Cancer Screening:  Pap Smear: Due; will postpone until after menstration   Physical Exam: BP 148/104 mmHg  Pulse 89  Temp(Src) 98.2 F (36.8 C) (Oral)  Ht  (1.753 m)  Wt 313 lb (141.976 kg)  BMI 46.20 kg/m2  LMP 02/17/2015  General: alert, well-developed, NAD, obese HEENT: NCAT, PERRLA, no injection and anicteric. EOMI. MMM, oral mucosa and oropharynx reveal no lesions or exudates. Poor dentition. Hirsutism. Neck: supple, full ROM, no thyromegaly. No deformities, masses, or tenderness noted.  Lungs: CTAB, normal respiratory effort, no crackles, and no  wheezes. Distant lung sounds due to habitus. Heart: Distant heart sounds due to habitus.  Abdomen: Bowel sounds normal; abdomen soft and nontender. No masses, organomegaly or hernias noted.  Pulses: intact  Extremities: No edema Neurologic: No focal deficits, +5 strength globally, sensation grossly intact, A&Ox3.  Skin: Intact without suspicious lesions or rashes. Warm and dry. Psych: Mood and affect are normal; no evidence of anxiety or depression.  ASSESSMENT & PLAN: 34 y.o. female presents for annual well woman/preventative exam. Please see problem specific assessment and plan.    Essential hypertension BP elevated today. Patient has not taken any of her medications. -Refills given for Lisinopril and HCTZ.  -RTC in 3-4 weeks for recheck of BP on medications  PCOS (polycystic ovarian syndrome) Presumed diagnosis based on clinical presentation. Patient is obese, hirsutism, HTN, HLD, irregular menses and known history of prior ovarian cyst requiring treatment. Will continue to treat medically. Check A1c today as pt has h/o GDM. Encouraged lifestyle modifications. Dicussed possibility of starting OCPs to help better manage side effects.    Irregular bleeding Most likely 2/2 PCOS and endometrial hyperplasia causing prolonged bleeding and irregular menstration. Discussed options with patient and starting medical treatment. She is to RTC in 3-4 weeks for further discussion after menstruation.   Well woman exam Pap smear not performed today due to menstruation. Patient to return to clinic for gyn exam. She received flu vaccine today. All other health maintenance items are up to date.     Orders  Placed This Encounter  Procedures  . Flu Vaccine QUAD 36+ mos IM  . CBC  . POCT glycosylated hemoglobin (Hb A1C)   Meds ordered this encounter  Medications  . lisinopril (PRINIVIL,ZESTRIL) 10 MG tablet    Sig: Take 1 tablet (10 mg total) by mouth daily.    Dispense:  90 tablet    Refill:  1   . hydrochlorothiazide (HYDRODIURIL) 25 MG tablet    Sig: Take 1 tablet (25 mg total) by mouth daily.    Dispense:  90 tablet    Refill:  3  . montelukast (SINGULAIR) 10 MG tablet    Sig: Take 1 tablet (10 mg total) by mouth at bedtime.    Dispense:  30 tablet    Refill:  3  . atorvastatin (LIPITOR) 40 MG tablet    Sig: Take 1 tablet (40 mg total) by mouth daily.    Dispense:  90 tablet    Refill:  3  . albuterol (VENTOLIN HFA) 108 (90 BASE) MCG/ACT inhaler    Sig: Inhale 2 puffs into the lungs every 6 (six) hours as needed for wheezing or shortness of breath.    Dispense:  1 Inhaler    Refill:  2    Caryl Ada, DO 02/18/2015, 2:15 PM PGY-2, [facility]

## 2015-02-19 ENCOUNTER — Encounter: Payer: Self-pay | Admitting: Obstetrics and Gynecology

## 2015-02-19 LAB — CBC
HEMATOCRIT: 30.9 % — AB (ref 36.0–46.0)
HEMOGLOBIN: 9.5 g/dL — AB (ref 12.0–15.0)
MCH: 22.5 pg — AB (ref 26.0–34.0)
MCHC: 30.7 g/dL (ref 30.0–36.0)
MCV: 73.2 fL — AB (ref 78.0–100.0)
MPV: 9.1 fL (ref 8.6–12.4)
Platelets: 429 10*3/uL — ABNORMAL HIGH (ref 150–400)
RBC: 4.22 MIL/uL (ref 3.87–5.11)
RDW: 18.2 % — ABNORMAL HIGH (ref 11.5–15.5)
WBC: 5.9 10*3/uL (ref 4.0–10.5)

## 2015-03-27 ENCOUNTER — Ambulatory Visit: Payer: Self-pay | Admitting: Obstetrics and Gynecology

## 2015-08-30 ENCOUNTER — Ambulatory Visit: Payer: Self-pay | Admitting: Obstetrics and Gynecology

## 2015-09-02 ENCOUNTER — Ambulatory Visit: Payer: Self-pay | Admitting: Obstetrics and Gynecology

## 2016-01-16 ENCOUNTER — Encounter: Payer: Self-pay | Admitting: Obstetrics and Gynecology

## 2016-01-16 ENCOUNTER — Other Ambulatory Visit (HOSPITAL_COMMUNITY)
Admission: RE | Admit: 2016-01-16 | Discharge: 2016-01-16 | Disposition: A | Discharge status: Home / Self Care | Payer: Medicaid Other | Source: Ambulatory Visit | Attending: Family Medicine | Admitting: Family Medicine

## 2016-01-16 ENCOUNTER — Ambulatory Visit (INDEPENDENT_AMBULATORY_CARE_PROVIDER_SITE_OTHER): Payer: Medicaid Other | Admitting: Obstetrics and Gynecology

## 2016-01-16 VITALS — BP 160/98 | HR 88 | Temp 98.7°F | Ht 69.0 in | Wt 302.0 lb

## 2016-01-16 DIAGNOSIS — D649 Anemia, unspecified: Secondary | ICD-10-CM | POA: Diagnosis not present

## 2016-01-16 DIAGNOSIS — J4520 Mild intermittent asthma, uncomplicated: Secondary | ICD-10-CM | POA: Diagnosis not present

## 2016-01-16 DIAGNOSIS — R7303 Prediabetes: Secondary | ICD-10-CM | POA: Diagnosis not present

## 2016-01-16 DIAGNOSIS — Z1151 Encounter for screening for human papillomavirus (HPV): Secondary | ICD-10-CM | POA: Insufficient documentation

## 2016-01-16 DIAGNOSIS — Z0000 Encounter for general adult medical examination without abnormal findings: Secondary | ICD-10-CM

## 2016-01-16 DIAGNOSIS — Z01419 Encounter for gynecological examination (general) (routine) without abnormal findings: Secondary | ICD-10-CM | POA: Diagnosis present

## 2016-01-16 DIAGNOSIS — E669 Obesity, unspecified: Secondary | ICD-10-CM

## 2016-01-16 DIAGNOSIS — I10 Essential (primary) hypertension: Secondary | ICD-10-CM

## 2016-01-16 LAB — COMPLETE METABOLIC PANEL WITH GFR
ALT: 28 U/L (ref 6–29)
AST: 19 U/L (ref 10–30)
Albumin: 4.1 g/dL (ref 3.6–5.1)
Alkaline Phosphatase: 91 U/L (ref 33–115)
BUN: 13 mg/dL (ref 7–25)
CO2: 25 mmol/L (ref 20–31)
Calcium: 8.8 mg/dL (ref 8.6–10.2)
Chloride: 105 mmol/L (ref 98–110)
Creat: 0.71 mg/dL (ref 0.50–1.10)
GFR, Est African American: >89 mL/min (ref >=60)
GLUCOSE: 86 mg/dL (ref 65–99)
Potassium: 4.4 mmol/L (ref 3.5–5.3)
SODIUM: 135 mmol/L (ref 135–146)
TOTAL PROTEIN: 7.8 g/dL (ref 6.1–8.1)
Total Bilirubin: 0.2 mg/dL (ref 0.2–1.2)

## 2016-01-16 LAB — CBC
HCT: 30 % — ABNORMAL LOW (ref 35.0–45.0)
Hemoglobin: 9 g/dL — ABNORMAL LOW (ref 11.7–15.5)
MCH: 19.5 pg — AB (ref 27.0–33.0)
MCHC: 30 g/dL — AB (ref 32.0–36.0)
MCV: 64.9 fL — AB (ref 80.0–100.0)
MPV: 8.9 fL (ref 7.5–12.5)
Platelets: 504 10*3/uL — ABNORMAL HIGH (ref 140–400)
RBC: 4.62 MIL/uL (ref 3.80–5.10)
RDW: 19.8 % — ABNORMAL HIGH (ref 11.0–15.0)
WBC: 6.7 10*3/uL (ref 3.8–10.8)

## 2016-01-16 LAB — POCT GLYCOSYLATED HEMOGLOBIN (HGB A1C): Hemoglobin A1C: 5.9

## 2016-01-16 MED ORDER — LISINOPRIL 10 MG PO TABS: 10.0000 mg | ORAL_TABLET | Freq: Every day | ORAL | 3 refills | Status: DC

## 2016-01-16 MED ORDER — ATORVASTATIN CALCIUM 40 MG PO TABS: 40.0000 mg | ORAL_TABLET | Freq: Every day | ORAL | 3 refills | Status: DC

## 2016-01-16 MED ORDER — HYDROCHLOROTHIAZIDE 25 MG PO TABS: 25.0000 mg | ORAL_TABLET | Freq: Every day | ORAL | 3 refills | Status: DC

## 2016-01-16 NOTE — Patient Instructions (Signed)
Sherri Campbell, it was nice seeing you in clinic today! We discussed your sensitive skin, blood pressure, and anxiety medication.  1. Sensitive skin: minimize scented body washes and lotions. Use over the counter cream to prevent itching and irritation. Try black soap to help with the darkening. 2. Blood pressure: your pressure was elevated to the 160s today, which is quite high. We are refilling your HCTZ and lisinopril.  3. Ritalin: Unfortunately we cannot refill this medication today, as it takes a follow-up visit to work up your anxiety further. Otherwise it is not indicated.  Health Maintenance, Female Adopting a healthy lifestyle and getting preventive care can go a long way to promote health and wellness. Talk with your health care provider about what schedule of regular examinations is right for you. This is a good chance for you to check in with your provider about disease prevention and staying healthy. In between checkups, there are plenty of things you can do on your own. Experts have done a lot of research about which lifestyle changes and preventive measures are most likely to keep you healthy. Ask your health care provider for more information. WEIGHT AND DIET  Eat a healthy diet  Be sure to include plenty of vegetables, fruits, low-fat dairy products, and lean protein.  Do not eat a lot of foods high in solid fats, added sugars, or salt.  Get regular exercise. This is one of the most important things you can do for your health.  Most adults should exercise for at least 150 minutes each week. The exercise should increase your heart rate and make you sweat (moderate-intensity exercise).  Most adults should also do strengthening exercises at least twice a week. This is in addition to the moderate-intensity exercise.  Maintain a healthy weight  Body mass index (BMI) is a measurement that can be used to identify possible weight problems. It estimates body fat based on height and  weight. Your health care provider can help determine your BMI and help you achieve or maintain a healthy weight.  For females 22 years of age and older:   A BMI below 18.5 is considered underweight.  A BMI of 18.5 to 24.9 is normal.  A BMI of 25 to 29.9 is considered overweight.  A BMI of 30 and above is considered obese.  Watch levels of cholesterol and blood lipids  You should start having your blood tested for lipids and cholesterol at 35 years of age, then have this test every 5 years.  You may need to have your cholesterol levels checked more often if:  Your lipid or cholesterol levels are high.  You are older than 35 years of age.  You are at high risk for heart disease.  CANCER SCREENING   Lung Cancer  Lung cancer screening is recommended for adults 79-13 years old who are at high risk for lung cancer because of a history of smoking.  A yearly low-dose CT scan of the lungs is recommended for people who:  Currently smoke.  Have quit within the past 15 years.  Have at least a 30-pack-year history of smoking. A pack year is smoking an average of one pack of cigarettes a day for 1 year.  Yearly screening should continue until it has been 15 years since you quit.  Yearly screening should stop if you develop a health problem that would prevent you from having lung cancer treatment.  Breast Cancer  Practice breast self-awareness. This means understanding how your breasts normally appear  and feel.  It also means doing regular breast self-exams. Let your health care provider know about any changes, no matter how small.  If you are in your 20s or 30s, you should have a clinical breast exam (CBE) by a health care provider every 1-3 years as part of a regular health exam.  If you are 72 or older, have a CBE every year. Also consider having a breast X-ray (mammogram) every year.  If you have a family history of breast cancer, talk to your health care provider about  genetic screening.  If you are at high risk for breast cancer, talk to your health care provider about having an MRI and a mammogram every year.  Breast cancer gene (BRCA) assessment is recommended for women who have family members with BRCA-related cancers. BRCA-related cancers include:  Breast.  Ovarian.  Tubal.  Peritoneal cancers.  Results of the assessment will determine the need for genetic counseling and BRCA1 and BRCA2 testing. Cervical Cancer Your health care provider may recommend that you be screened regularly for cancer of the pelvic organs (ovaries, uterus, and vagina). This screening involves a pelvic examination, including checking for microscopic changes to the surface of your cervix (Pap test). You may be encouraged to have this screening done every 3 years, beginning at age 68.  For women ages 23-65, health care providers may recommend pelvic exams and Pap testing every 3 years, or they may recommend the Pap and pelvic exam, combined with testing for human papilloma virus (HPV), every 5 years. Some types of HPV increase your risk of cervical cancer. Testing for HPV may also be done on women of any age with unclear Pap test results.  Other health care providers may not recommend any screening for nonpregnant women who are considered low risk for pelvic cancer and who do not have symptoms. Ask your health care provider if a screening pelvic exam is right for you.  If you have had past treatment for cervical cancer or a condition that could lead to cancer, you need Pap tests and screening for cancer for at least 20 years after your treatment. If Pap tests have been discontinued, your risk factors (such as having a new sexual partner) need to be reassessed to determine if screening should resume. Some women have medical problems that increase the chance of getting cervical cancer. In these cases, your health care provider may recommend more frequent screening and Pap  tests. Colorectal Cancer  This type of cancer can be detected and often prevented.  Routine colorectal cancer screening usually begins at 35 years of age and continues through 35 years of age.  Your health care provider may recommend screening at an earlier age if you have risk factors for colon cancer.  Your health care provider may also recommend using home test kits to check for hidden blood in the stool.  A small camera at the end of a tube can be used to examine your colon directly (sigmoidoscopy or colonoscopy). This is done to check for the earliest forms of colorectal cancer.  Routine screening usually begins at age 52.  Direct examination of the colon should be repeated every 5-10 years through 35 years of age. However, you may need to be screened more often if early forms of precancerous polyps or small growths are found. Skin Cancer  Check your skin from head to toe regularly.  Tell your health care provider about any new moles or changes in moles, especially if there is a  change in a mole's shape or color.  Also tell your health care provider if you have a mole that is larger than the size of a pencil eraser.  Always use sunscreen. Apply sunscreen liberally and repeatedly throughout the day.  Protect yourself by wearing long sleeves, pants, a wide-brimmed hat, and sunglasses whenever you are outside. HEART DISEASE, DIABETES, AND HIGH BLOOD PRESSURE   High blood pressure causes heart disease and increases the risk of stroke. High blood pressure is more likely to develop in:  People who have blood pressure in the high end of the normal range (130-139/85-89 mm Hg).  People who are overweight or obese.  People who are African American.  If you are 60-60 years of age, have your blood pressure checked every 3-5 years. If you are 66 years of age or older, have your blood pressure checked every year. You should have your blood pressure measured twice--once when you are at a  hospital or clinic, and once when you are not at a hospital or clinic. Record the average of the two measurements. To check your blood pressure when you are not at a hospital or clinic, you can use:  An automated blood pressure machine at a pharmacy.  A home blood pressure monitor.  If you are between 38 years and 36 years old, ask your health care provider if you should take aspirin to prevent strokes.  Have regular diabetes screenings. This involves taking a blood sample to check your fasting blood sugar level.  If you are at a normal weight and have a low risk for diabetes, have this test once every three years after 35 years of age.  If you are overweight and have a high risk for diabetes, consider being tested at a younger age or more often. PREVENTING INFECTION  Hepatitis B  If you have a higher risk for hepatitis B, you should be screened for this virus. You are considered at high risk for hepatitis B if:  You were born in a country where hepatitis B is common. Ask your health care provider which countries are considered high risk.  Your parents were born in a high-risk country, and you have not been immunized against hepatitis B (hepatitis B vaccine).  You have HIV or AIDS.  You use needles to inject street drugs.  You live with someone who has hepatitis B.  You have had sex with someone who has hepatitis B.  You get hemodialysis treatment.  You take certain medicines for conditions, including cancer, organ transplantation, and autoimmune conditions. Hepatitis C  Blood testing is recommended for:  Everyone born from [birth year] through 1965.  Anyone with known risk factors for hepatitis C. Sexually transmitted infections (STIs)  You should be screened for sexually transmitted infections (STIs) including gonorrhea and chlamydia if:  You are sexually active and are younger than 35 years of age.  You are older than 35 years of age and your health care provider tells you  that you are at risk for this type of infection.  Your sexual activity has changed since you were last screened and you are at an increased risk for chlamydia or gonorrhea. Ask your health care provider if you are at risk.  If you do not have HIV, but are at risk, it may be recommended that you take a prescription medicine daily to prevent HIV infection. This is called pre-exposure prophylaxis (PrEP). You are considered at risk if:  You are sexually active and do not regularly use  condoms or know the HIV status of your partner(s).  You take drugs by injection.  You are sexually active with a partner who has HIV. Talk with your health care provider about whether you are at high risk of being infected with HIV. If you choose to begin PrEP, you should first be tested for HIV. You should then be tested every 3 months for as long as you are taking PrEP.  PREGNANCY   If you are premenopausal and you may become pregnant, ask your health care provider about preconception counseling.  If you may become pregnant, take 400 to 800 micrograms (mcg) of folic acid every day.  If you want to prevent pregnancy, talk to your health care provider about birth control (contraception). OSTEOPOROSIS AND MENOPAUSE   Osteoporosis is a disease in which the bones lose minerals and strength with aging. This can result in serious bone fractures. Your risk for osteoporosis can be identified using a bone density scan.  If you are 38 years of age or older, or if you are at risk for osteoporosis and fractures, ask your health care provider if you should be screened.  Ask your health care provider whether you should take a calcium or vitamin D supplement to lower your risk for osteoporosis.  Menopause may have certain physical symptoms and risks.  Hormone replacement therapy may reduce some of these symptoms and risks. Talk to your health care provider about whether hormone replacement therapy is right for you.  HOME  CARE INSTRUCTIONS   Schedule regular health, dental, and eye exams.  Stay current with your immunizations.   Do not use any tobacco products including cigarettes, chewing tobacco, or electronic cigarettes.  If you are pregnant, do not drink alcohol.  If you are breastfeeding, limit how much and how often you drink alcohol.  Limit alcohol intake to no more than 1 drink per day for nonpregnant women. One drink equals 12 ounces of beer, 5 ounces of wine, or 1 ounces of hard liquor.  Do not use street drugs.  Do not share needles.  Ask your health care provider for help if you need support or information about quitting drugs.  Tell your health care provider if you often feel depressed.  Tell your health care provider if you have ever been abused or do not feel safe at home.   This information is not intended to replace advice given to you by your health care provider. Make sure you discuss any questions you have with your health care provider.   Document Released: 12/08/2010 Document Revised: 06/15/2014 Document Reviewed: 04/26/2013 Elsevier Interactive Patient Education [employer].

## 2016-01-16 NOTE — Progress Notes (Signed)
35 y.o. year old female presents for well woman/preventative visit and annual GYN examination.  Acute Concerns:  Sensitive Skin Says that she tends to break out. This has been going on for several weeks. The skin lesions are spread over the arms and upper trunk. Itches.   Diet: Cooks at home but also eats fast food. Says she gets the appropriate amount of fruits and vegetables. Likes to snack on sweets (chips, gummies, cookies).   Exercise: Denies consistent exercise; tries to walk every other day  Sexual History: Active w boyfriend of 8 years; feels safe in relationship  Birth history: [id number]  LMP: No LMP recorded. Patient is not currently having periods (Reason: Irregular Periods).  Birth Control: Tubes tied.  POA/Living Will: n/a  Social:  Social History   Social History  . Marital status: Single    Spouse name: N/A  . Number of children: N/A  . Years of education: N/A   Social History Main Topics  . Smoking status: Never Smoker  . Smokeless tobacco: None  . Alcohol use Yes  . Drug use: No  . Sexual activity: Not Asked   Other Topics Concern  . None   Social History Narrative  . None    Immunization:  Tdap/TD: Up to Date  Influenza: Due in Fall  Cancer Screening:  Pap Smear: Due   ROS reviewed and were negative unless otherwise noted in HPI.   Physical Exam: Vitals:   01/16/16 1452 01/16/16 1535  BP: (!) 185/116 (!) 160/98  Pulse: 88   Temp: 98.7 F (37.1 C)    GEN: Well-appearing female in no acute distress HEENT: MMM. EOMI. PERRL. No cervical adenopathy CARDIAC: RRR. Nl S1 S2. No murmurs appreciated on auscultation. RESP: Occasional end expiratory wheezes bilaterally consistent with hx of asthma. Normal work of breathing ABD: Obese. Soft nontender nondistended. Normal bowel sounds. GU/GYN:Exam performed in the presence of a chaperone. Genitalia: Normal introitus for age, no external lesions, no vaginal discharge, mucosa pink and moist, no  vaginal or cervical lesions, no vaginal atrophy, no friaility or hemorrhage, normal uterus size and position, no adnexal masses or tenderness EXT: moves all extremities. Trace edema SKIN: diffuse hyperpigmented macules on the upper extremities. Scars.  ASSESSMENT & PLAN: 35 y.o. female presents for annual well woman/preventative exam and GYN exam. Please see problem specific assessment and plan.    Skin Lesions: The patient reports that she has sensitive skin and has had breakouts in the past due to using specific hygiene products -advised to avoid scented  Body wash and lotions (recommended dove soap) -return to clinic if symptoms persist, as could be related to eczema -encouraged the use of her singulair   Orders Placed This Encounter  Procedures  . COMPLETE METABOLIC PANEL WITH GFR  . CBC  . POCT glycosylated hemoglobin (Hb A1C)   Meds ordered this encounter  Medications  . atorvastatin (LIPITOR) 40 MG tablet    Sig: Take 1 tablet (40 mg total) by mouth daily.    Dispense:  90 tablet    Refill:  3  . lisinopril (PRINIVIL,ZESTRIL) 10 MG tablet    Sig: Take 1 tablet (10 mg total) by mouth daily.    Dispense:  90 tablet    Refill:  3  . hydrochlorothiazide (HYDRODIURIL) 25 MG tablet    Sig: Take 1 tablet (25 mg total) by mouth daily.    Dispense:  90 tablet    Refill:  3    Caryl AdaJazma Maddyson Keil, DO PGY-3, [facility]

## 2016-01-17 LAB — CYTOLOGY - PAP

## 2016-01-18 DIAGNOSIS — D509 Iron deficiency anemia, unspecified: Secondary | ICD-10-CM | POA: Insufficient documentation

## 2016-01-18 DIAGNOSIS — D649 Anemia, unspecified: Secondary | ICD-10-CM | POA: Insufficient documentation

## 2016-01-18 NOTE — Assessment & Plan Note (Signed)
Last CBC with signs of anemia. Hbg was 9.5 in 2016. Will recheck today.

## 2016-01-18 NOTE — Assessment & Plan Note (Signed)
Chronic long standing condition that is not well controlled. Non-compliance with medications. Patient runs out of medication and does not get refills until comes into clinic. Blood pressure elevated today, pts states that she ran out of medications.  -will refill lisinopril and HCTZ -return to clinic for nurse visit to check BP on medications -counseled on long-term consequences of uncontrolled HTN -blood work collected today

## 2016-01-18 NOTE — Assessment & Plan Note (Signed)
BMI = 44. Encouraged patient to work on diet and exercise. Advise given with handout.

## 2016-01-18 NOTE — Assessment & Plan Note (Signed)
Concern for diabetes in patient with PCOS, hyperglycemia noted on random blood glucoses, and prior A1c of 6.1 (prediabetic range). Will collect A1c today. Discussed patient's high risk nature and what she could do to avoid this. She would be a good candidate for metformin given PCOS and prediabetes.

## 2016-01-18 NOTE — Assessment & Plan Note (Addendum)
Wheezes appreciated on exam. Patient without respiratory distress. Wheezing is intermittent. Encouraged her to use her albuterol prn when she has wheezing or SOB and her daily singulair. Consider escalating therapy if symptoms become persistent.

## 2016-01-18 NOTE — Assessment & Plan Note (Addendum)
Pap completed today. Otherwise up to date on all health maintenance. Will collect routine blood work. Encouraged her to return in the fall for flu vaccine. ASCVD risk increased for patient encouraged her to take her statin. Refill given. Handout on health maintenance given.

## 2016-01-24 ENCOUNTER — Encounter: Payer: Self-pay | Admitting: Obstetrics and Gynecology

## 2016-01-24 DIAGNOSIS — D649 Anemia, unspecified: Secondary | ICD-10-CM

## 2016-01-24 NOTE — Progress Notes (Signed)
Patient with significant anemia. She needs to come in for follow-up lab work to further work-up anemia. Will then likely need to start her on medication to improve results.   Unable to call and tell patient as number in chart is no longer in service. Letter with this information will me mailed out to her.

## 2016-01-28 ENCOUNTER — Ambulatory Visit: Payer: Self-pay | Admitting: Obstetrics and Gynecology

## 2016-08-19 ENCOUNTER — Emergency Department (HOSPITAL_COMMUNITY): Payer: Medicaid Other

## 2016-08-19 ENCOUNTER — Emergency Department (HOSPITAL_COMMUNITY)
Admission: EM | Admit: 2016-08-19 | Discharge: 2016-08-19 | Disposition: A | Discharge status: Home / Self Care | Payer: Medicaid Other | Attending: Emergency Medicine | Admitting: Emergency Medicine

## 2016-08-19 ENCOUNTER — Encounter (HOSPITAL_COMMUNITY): Payer: Self-pay | Admitting: Emergency Medicine

## 2016-08-19 DIAGNOSIS — I10 Essential (primary) hypertension: Secondary | ICD-10-CM | POA: Diagnosis not present

## 2016-08-19 DIAGNOSIS — M436 Torticollis: Secondary | ICD-10-CM

## 2016-08-19 DIAGNOSIS — J45909 Unspecified asthma, uncomplicated: Secondary | ICD-10-CM | POA: Insufficient documentation

## 2016-08-19 DIAGNOSIS — M542 Cervicalgia: Secondary | ICD-10-CM | POA: Diagnosis present

## 2016-08-19 MED ORDER — DIAZEPAM 5 MG PO TABS
10.0000 mg | ORAL_TABLET | Freq: Once | ORAL | Status: AC
  Administered 2016-08-19: 10 mg via ORAL
  Filled 2016-08-19: qty 2

## 2016-08-19 MED ORDER — KETOROLAC TROMETHAMINE 60 MG/2ML IM SOLN
60.0000 mg | Freq: Once | INTRAMUSCULAR | Status: AC
  Administered 2016-08-19: 60 mg via INTRAMUSCULAR
  Filled 2016-08-19: qty 2

## 2016-08-19 MED ORDER — CYCLOBENZAPRINE HCL 10 MG PO TABS: 10.0000 mg | ORAL_TABLET | Freq: Three times a day (TID) | ORAL | 0 refills | Status: DC

## 2016-08-19 MED ORDER — DICLOFENAC SODIUM 75 MG PO TBEC: 75.0000 mg | DELAYED_RELEASE_TABLET | Freq: Two times a day (BID) | ORAL | 0 refills | Status: DC

## 2016-08-19 MED ORDER — DIAZEPAM 5 MG PO TABS
ORAL_TABLET | ORAL | Status: AC
  Filled 2016-08-19: qty 1

## 2016-08-19 MED ORDER — TRAMADOL HCL 50 MG PO TABS: 50.0000 mg | ORAL_TABLET | Freq: Four times a day (QID) | ORAL | 0 refills | Status: DC | PRN

## 2016-08-19 NOTE — ED Triage Notes (Signed)
Pt states she has had a pain in the left side of her neck upon waking for over six months.   Also is out of her bp meds.

## 2016-08-19 NOTE — ED Provider Notes (Signed)
[facility] Provider Note   CSN: [id number] Arrival date & time: 08/19/16  1009     History   Chief Complaint Chief Complaint  Patient presents with  . Torticollis    HPI Sherri Campbell is a 36 y.o. female.  Patient is a 36 year old female who presents to the emergency department with a complaint of left neck pain and shoulder pain.  The patient states that she has had this problem for several months on. She states that in the last 2-3 days it has gotten worse. She says that she has been evaluated by [facility], but was only told to use ibuprofen. The patient states she has a history of hypertension, no other significant medical issues to be reported. She's not had any recent injury. There's been no recent operations or procedures involving the neck or shoulder. No motor vehicle collisions or other trauma.   The history is provided by the patient.    Past Medical History:  Diagnosis Date  . ADHD (attention deficit hyperactivity disorder)   . Asthma   . Hypertension   . Seasonal allergies     Patient Active Problem List   Diagnosis Date Noted  . Anemia 01/18/2016  . Prediabetes 01/16/2016  . PCOS (polycystic ovarian syndrome) 02/18/2015  . Irregular bleeding 02/18/2015  . Well woman exam 02/18/2015  . Chest pain, unspecified 10/19/2012  . Essential hypertension 01/31/2010  . HIRSUTISM 01/31/2010  . Obesity 07/12/2008  . ALLERGIC RHINITIS 11/10/2006  . Asthma 08/05/2006    Past Surgical History:  Procedure Laterality Date  . TONSILLECTOMY    . TUBAL LIGATION      OB History    No data available       Home Medications    Prior to Admission medications   Medication Sig Start Date End Date Taking? Authorizing Provider  atorvastatin (LIPITOR) 40 MG tablet Take 1 tablet (40 mg total) by mouth daily. 01/16/16  Yes Pincus Large, DO  hydrochlorothiazide (HYDRODIURIL) 25 MG tablet Take 1 tablet (25 mg total) by mouth  daily. 01/16/16  Yes Pincus Large, DO  lisinopril (PRINIVIL,ZESTRIL) 10 MG tablet Take 1 tablet (10 mg total) by mouth daily. 01/16/16  Yes Pincus Large, DO  albuterol (VENTOLIN HFA) 108 (90 BASE) MCG/ACT inhaler Inhale 2 puffs into the lungs every 6 (six) hours as needed for wheezing or shortness of breath. 02/18/15   Pincus Large, DO  cetirizine (ZYRTEC) 10 MG tablet Take 1 tablet (10 mg total) by mouth daily. Patient not taking: Reported on 08/19/2016 10/09/13   Charm Rings, MD  diclofenac (CATAFLAM) 50 MG tablet Take 1 tablet (50 mg total) by mouth 3 (three) times daily. One tablet TID with food prn pain. Patient not taking: Reported on 08/19/2016 06/04/14   Hayden Rasmussen, NP  fluticasone [facility]) 50 MCG/ACT nasal spray Place 2 sprays into both nostrils daily. 10/09/13   Charm Rings, MD  methylphenidate (RITALIN) 10 MG tablet Take 10 mg by mouth 2 (two) times daily.    Historical Provider, MD  montelukast (SINGULAIR) 10 MG tablet Take 1 tablet (10 mg total) by mouth at bedtime. Patient not taking: Reported on 08/19/2016 02/18/15   Pincus Large, DO  NITROSTAT 0.4 MG SL tablet place 1 tablet under the tongue if needed every 5 minutes for chest pain for 3 doses IF NO RELIEF AFTER 3RD DOSE CALL PRESCRIBER OR 911. 08/05/14   Leona Singleton, MD    Family  History Family History  Problem Relation Age of Onset  . Heart disease Mother     Social History Social History  Substance Use Topics  . Smoking status: Never Smoker  . Smokeless tobacco: Not on file  . Alcohol use Yes     Allergies   Patient has no known allergies.   Review of Systems Review of Systems  Musculoskeletal: Positive for neck pain.  Neurological: Positive for headaches.  All other systems reviewed and are negative.    Physical Exam Updated Vital Signs BP (!) 183/106   Pulse 83   Temp 98 F (36.7 C) (Oral)   Resp 18   Ht 5\' 9"  (1.753 m)   Wt 136.1 kg   LMP 08/05/2016   SpO2 98%   BMI 44.30 kg/m    Physical Exam  Constitutional: She is oriented to person, place, and time. She appears well-developed and well-nourished.  Non-toxic appearance.  HENT:  Head: Normocephalic.  Right Ear: Tympanic membrane and external ear normal.  Left Ear: Tympanic membrane and external ear normal.  Eyes: EOM and lids are normal. Pupils are equal, round, and reactive to light.  Neck: Trachea normal. Neck supple. No JVD present. Muscular tenderness present. Carotid bruit is not present. No neck rigidity. Decreased range of motion present. No edema present.    Cardiovascular: Normal rate, regular rhythm, normal heart sounds, intact distal pulses and normal pulses.   Pulmonary/Chest: Breath sounds normal. No stridor. No respiratory distress.  Abdominal: Soft. Bowel sounds are normal. There is no tenderness. There is no guarding.  Lymphadenopathy:       Head (right side): No submandibular adenopathy present.       Head (left side): No submandibular adenopathy present.    She has no cervical adenopathy.  Neurological: She is alert and oriented to person, place, and time. She has normal strength. No cranial nerve deficit or sensory deficit. She exhibits normal muscle tone. Coordination normal.  There is good range of motion of the left shoulder, but with pain present. There is no atrophy of the bicep tricep area. Motor strength of the right left upper extremities are symmetrical. No sensory deficits appreciated. No atrophy of the thenar eminence of the right or the left.  Skin: Skin is warm and dry.  Psychiatric: She has a normal mood and affect. Her speech is normal.  Nursing note and vitals reviewed.    ED Treatments / Results  Labs (all labs ordered are listed, but only abnormal results are displayed) Labs Reviewed  POC URINE PREG, ED    EKG  EKG Interpretation None       Radiology No results found.  Procedures Procedures (including critical care time)  Medications Ordered in  ED Medications  diazepam (VALIUM) tablet 10 mg (not administered)  ketorolac (TORADOL) injection 60 mg (not administered)     Initial Impression / Assessment and Plan / ED Course  I have reviewed the triage vital signs and the nursing notes.  Pertinent labs & imaging results that were available during my care of the patient were reviewed by me and considered in my medical decision making (see chart for details).     **I have reviewed nursing notes, vital signs, and all appropriate lab and imaging results for this patient.*  Final Clinical Impressions(s) / ED Diagnoses MDM Patient states that she has had problems with her left neck and shoulder for several months. In the last 2-3 days this is been getting worse. She has tried Tylenol and this  is not working effectively for her. There no gross neurologic deficits appreciated on examination at this time. Patient will be treated with oral muscle relaxer, and intramuscular anti-inflammatory pain medication. Will obtain x-ray of the cervical spine.   Recheck. No gross neurologic deficits appreciated. Patient states she is feeling some better after pain medications given here in the emergency department on.  X-ray of the cervical spine show some loss of the normal cervical lordosis. There is no fracture or dislocation appreciated at this time.  Patient will be treated with Flexeril and diclofenac. The patient is to follow-up with Dr. Romeo Apple for orthopedic evaluation and management if these are not effective. The patient will also use a heating pad to the area. Patient knowledge is these discharge instructions, and is in agreement.    Final diagnoses:  Torticollis    New Prescriptions New Prescriptions   No medications on file     Ivery Quale, PA-C 08/19/16 1226    Samuel Jester, DO 08/20/16 1526

## 2016-08-19 NOTE — Discharge Instructions (Signed)
Your x-ray is negative for any fracture or dislocation. Please use Flexeril and a cough and neck for your spasm pain related to torticollis. May use Ultram for more severe pain. Please see Dr. Romeo AppleHarrison for orthopedic evaluation and management if these medications are not effective. Using a heating pad to your neck and shoulder may also be helpful.

## 2016-08-19 NOTE — ED Notes (Addendum)
One 5 mg of valium dropped in floor, wasted with Eustace QuailMegan Mitchell, RN and pulled another 5mg  Valium to give pt for total of 10 mg

## 2017-01-04 ENCOUNTER — Ambulatory Visit: Payer: Self-pay

## 2017-05-03 NOTE — Progress Notes (Deleted)
   Subjective:   Patient ID: Sherri Campbell    DOB: 04/28/1981, 36 y.o. female   MRN: [id number]  Sherri Campbell is a 36 y.o. female with a history of HTN, asthma, PCOS, prediabetes here for ***  Hypertension: - Medications: HCTZ 25mg , lisinopril 10mg  - Compliance: *** - Checking BP at home: *** - Denies any SOB, CP, vision changes, LE edema, medication SEs, or symptoms of hypotension - Diet: *** - Exercise: ***  Healthcare Maintenance - Vaccines: flu - Pap Smear: 01/2016, due 2020 - last A1c 5.9 01/2016  Review of Systems:  Per HPI.   PMFSH: reviewed. Smoking status reviewed. Medications reviewed.  Objective:   There were no vitals taken for this visit. Vitals and nursing note reviewed.  General: well nourished, well developed, in no acute distress with non-toxic appearance HEENT: normocephalic, atraumatic, moist mucous membranes Neck: supple, non-tender without lymphadenopathy CV: regular rate and rhythm without murmurs, rubs, or gallops, no lower extremity edema Lungs: clear to auscultation bilaterally with normal work of breathing Abdomen: soft, non-tender, non-distended, no masses or organomegaly palpable, normoactive bowel sounds Skin: warm, dry, no rashes or lesions, cap refill < 2 seconds Extremities: warm and well perfused, normal tone MSK: Full ROM, strength intact, gait normal Neuro: Alert and oriented, speech normal  Assessment & Plan:   No problem-specific Assessment & Plan notes found for this encounter.  No orders of the defined types were placed in this encounter.  No orders of the defined types were placed in this encounter.   Sherri DenseAlison Rumball, DO PGY-1, [facility] 05/03/2017 6:11 PM

## 2017-05-04 ENCOUNTER — Ambulatory Visit: Payer: Self-pay | Admitting: Family Medicine

## 2017-06-18 ENCOUNTER — Ambulatory Visit (HOSPITAL_COMMUNITY)
Admission: EM | Admit: 2017-06-18 | Discharge: 2017-06-18 | Disposition: A | Discharge status: Home / Self Care | Payer: Medicaid Other

## 2017-06-18 ENCOUNTER — Other Ambulatory Visit: Payer: Self-pay

## 2017-06-18 ENCOUNTER — Encounter (HOSPITAL_COMMUNITY): Payer: Self-pay | Admitting: Emergency Medicine

## 2017-06-18 DIAGNOSIS — L089 Local infection of the skin and subcutaneous tissue, unspecified: Secondary | ICD-10-CM | POA: Diagnosis not present

## 2017-06-18 LAB — POCT PREGNANCY, URINE: Preg Test, Ur: NEGATIVE

## 2017-06-18 MED ORDER — MUPIROCIN CALCIUM 2 % EX CREA: 1.0000 "application " | TOPICAL_CREAM | Freq: Two times a day (BID) | CUTANEOUS | 0 refills | Status: DC

## 2017-06-18 NOTE — ED Triage Notes (Signed)
[facility] a [facility] secondary to lifting heavy objects at work. The patient reported that she has had drainage out of her umbilicus.

## 2017-06-18 NOTE — Discharge Instructions (Signed)
Return if any problems.

## 2017-06-19 NOTE — ED Provider Notes (Signed)
[facility]    CSN: [id number] Arrival date & time: 06/18/17  1702     History   Chief Complaint Chief Complaint  Patient presents with  . Abdominal Pain    HPI Sherri Campbell is a 37 y.o. female.   Pt complains of drainage from belly button.  Pt reports foul odor.  Pt reports it is at the site of her tubal ligation incision.  Pt also request pregnancy test   The history is provided by the patient. No language interpreter was used.  Abdominal Pain  Pain location: umbilicus. Pain quality: aching   Pain radiates to:  Does not radiate   Past Medical History:  Diagnosis Date  . ADHD (attention deficit hyperactivity disorder)   . Asthma   . Hypertension   . Seasonal allergies     Patient Active Problem List   Diagnosis Date Noted  . Anemia 01/18/2016  . Prediabetes 01/16/2016  . PCOS (polycystic ovarian syndrome) 02/18/2015  . Irregular bleeding 02/18/2015  . Well woman exam 02/18/2015  . Chest pain, unspecified 10/19/2012  . Essential hypertension 01/31/2010  . HIRSUTISM 01/31/2010  . Obesity 07/12/2008  . ALLERGIC RHINITIS 11/10/2006  . Asthma 08/05/2006    Past Surgical History:  Procedure Laterality Date  . TONSILLECTOMY    . TUBAL LIGATION      OB History    No data available       Home Medications    Prior to Admission medications   Medication Sig Start Date End Date Taking? Authorizing Provider  FLUoxetine (PROZAC) 20 MG tablet Take 20 mg by mouth daily.   Yes [provider]  mupirocin cream (BACTROBAN) 2 % Apply 1 application topically 2 (two) times daily. 06/18/17   Elson AreasSofia, Jarryd Gratz K, PA-C    Family History Family History  Problem Relation Age of Onset  . Heart disease Mother     Social History Social History   Tobacco Use  . Smoking status: Never Smoker  Substance Use Topics  . Alcohol use: Yes  . Drug use: No     Allergies   Patient has no known allergies.   Review of Systems Review of Systems    Gastrointestinal: Positive for abdominal pain.  All other systems reviewed and are negative.    Physical Exam Triage Vital Signs ED Triage Vitals  Enc Vitals Group     BP 06/18/17 1739 (!) 173/110     Pulse Rate 06/18/17 1739 84     Resp 06/18/17 1739 20     Temp 06/18/17 1739 98.4 F (36.9 C)     Temp Source 06/18/17 1739 Oral     SpO2 06/18/17 1739 100 %     Weight --      Height --      Head Circumference --      Peak Flow --      Pain Score 06/18/17 1736 8     Pain Loc --      Pain Edu? --      Excl. in GC? --    No data found.  Updated Vital Signs BP (!) 173/110 (BP Location: Left Arm) Comment: x 2  Pulse 84   Temp 98.4 F (36.9 C) (Oral)   Resp 20   SpO2 100%   Visual Acuity Right Eye Distance:   Left Eye Distance:   Bilateral Distance:    Right Eye Near:   Left Eye Near:    Bilateral Near:     Physical Exam  Constitutional: She appears well-developed and well-nourished. No distress.  HENT:  Head: Normocephalic and atraumatic.  Eyes: Conjunctivae are normal.  Neck: Neck supple.  Cardiovascular: Normal rate and regular rhythm.  No murmur heard. Pulmonary/Chest: Effort normal and breath sounds normal. No respiratory distress.  Abdominal: Soft. There is no tenderness.  Erythema skin of umbilicus  Musculoskeletal: She exhibits no edema.  Neurological: She is alert.  Skin: Skin is warm and dry.  Psychiatric: She has a normal mood and affect.  Nursing note and vitals reviewed.    UC Treatments / Results  Labs (all labs ordered are listed, but only abnormal results are displayed) Labs Reviewed  POCT PREGNANCY, URINE    EKG  EKG Interpretation None       Radiology No results found.  Procedures Procedures (including critical care time)  Medications Ordered in UC Medications - No data to display   Initial Impression / Assessment and Plan / UC Course  I have reviewed the triage vital signs and the nursing notes.  Pertinent labs &  imaging results that were available during my care of the patient were reviewed by me and considered in my medical decision making (see chart for details).     I advised topical bactroban,    Final Clinical Impressions(s) / UC Diagnoses   Final diagnoses:  Skin infection    ED Discharge Orders        Ordered    mupirocin cream (BACTROBAN) 2 %  2 times daily     06/18/17 1920       Controlled Substance Prescriptions [place] Controlled Substance Registry consu lted? Not Applicable  An After Visit Summary was printed and given to the patient.    Elson Areas, [place] 06/19/17 1706

## 2017-11-08 NOTE — Progress Notes (Deleted)
   Subjective:   Patient ID: Sherri Campbell    DOB: 07/05/1980, 37 y.o. female   MRN: [id number]  Sherri Campbell is a [age] y.o. female with a history of asthma, allergies here for   RASH  Had rash for *** days. Location: *** Medications tried: *** Similar rash in past: *** Patient believes may be caused by ***  New medications or antibiotics: *** Tick, Insect or new pet exposure: *** Recent travel: *** New detergent or soap: *** Immunocompromised: ***  Symptoms Itching: *** Pain over rash: *** Feeling ill all over: *** Fever: *** Mouth sores: *** Face or tongue swelling: *** Trouble breathing: *** Joint swelling or pain: ***  Review of Systems:  Per HPI.   PMFSH: reviewed. Smoking status reviewed. Medications reviewed.  Objective:   There were no vitals taken for this visit. Vitals and nursing note reviewed.  General: well nourished, well developed, in no acute distress with non-toxic appearance HEENT: normocephalic, atraumatic, moist mucous membranes Neck: supple, non-tender without lymphadenopathy CV: regular rate and rhythm without murmurs, rubs, or gallops, no lower extremity edema Lungs: clear to auscultation bilaterally with normal work of breathing Abdomen: soft, non-tender, non-distended, no masses or organomegaly palpable, normoactive bowel sounds Skin: warm, dry, no rashes or lesions Extremities: warm and well perfused, normal tone MSK: ROM grossly intact, strength intact, gait normal Neuro: Alert and oriented, speech normal  Assessment & Plan:   No problem-specific Assessment & Plan notes found for this encounter.  No orders of the defined types were placed in this encounter.  No orders of the defined types were placed in this encounter.   Ellwood DenseAlison Talley Casco, DO PGY-1, [facility] 11/08/2017 5:53 PM

## 2017-11-09 ENCOUNTER — Ambulatory Visit: Payer: Medicaid Other | Admitting: Family Medicine

## 2018-07-01 ENCOUNTER — Emergency Department (HOSPITAL_COMMUNITY): Payer: Medicaid Other

## 2018-07-01 ENCOUNTER — Emergency Department (HOSPITAL_COMMUNITY)
Admission: EM | Admit: 2018-07-01 | Discharge: 2018-07-01 | Disposition: A | Discharge status: Home / Self Care | Payer: Medicaid Other | Attending: Emergency Medicine | Admitting: Emergency Medicine

## 2018-07-01 ENCOUNTER — Other Ambulatory Visit: Payer: Self-pay

## 2018-07-01 ENCOUNTER — Encounter (HOSPITAL_COMMUNITY): Payer: Self-pay

## 2018-07-01 DIAGNOSIS — N939 Abnormal uterine and vaginal bleeding, unspecified: Secondary | ICD-10-CM | POA: Diagnosis present

## 2018-07-01 DIAGNOSIS — I10 Essential (primary) hypertension: Secondary | ICD-10-CM | POA: Insufficient documentation

## 2018-07-01 DIAGNOSIS — R1084 Generalized abdominal pain: Secondary | ICD-10-CM | POA: Diagnosis not present

## 2018-07-01 DIAGNOSIS — J45909 Unspecified asthma, uncomplicated: Secondary | ICD-10-CM | POA: Diagnosis not present

## 2018-07-01 DIAGNOSIS — N390 Urinary tract infection, site not specified: Secondary | ICD-10-CM | POA: Insufficient documentation

## 2018-07-01 LAB — CBC WITH DIFFERENTIAL/PLATELET
Abs Immature Granulocytes: 0.01 10*3/uL (ref 0.00–0.07)
BASOS ABS: 0.1 10*3/uL (ref 0.0–0.1)
Basophils Relative: 1 %
EOS PCT: 1 %
Eosinophils Absolute: 0.1 10*3/uL (ref 0.0–0.5)
HEMATOCRIT: 32.1 % — AB (ref 36.0–46.0)
Hemoglobin: 8.2 g/dL — ABNORMAL LOW (ref 12.0–15.0)
Immature Granulocytes: 0 %
Lymphocytes Relative: 43 %
Lymphs Abs: 2 10*3/uL (ref 0.7–4.0)
MCH: 16.9 pg — ABNORMAL LOW (ref 26.0–34.0)
MCHC: 25.5 g/dL — AB (ref 30.0–36.0)
MCV: 66 fL — ABNORMAL LOW (ref 80.0–100.0)
Monocytes Absolute: 0.4 10*3/uL (ref 0.1–1.0)
Monocytes Relative: 9 %
Neutro Abs: 2.1 10*3/uL (ref 1.7–7.7)
Neutrophils Relative %: 46 %
Platelets: 563 10*3/uL — ABNORMAL HIGH (ref 150–400)
RBC: 4.86 MIL/uL (ref 3.87–5.11)
RDW: 22.6 % — ABNORMAL HIGH (ref 11.5–15.5)
WBC: 4.6 10*3/uL (ref 4.0–10.5)
nRBC: 0 % (ref 0.0–0.2)

## 2018-07-01 LAB — URINALYSIS, ROUTINE W REFLEX MICROSCOPIC
Bilirubin Urine: NEGATIVE
Glucose, UA: NEGATIVE mg/dL
Ketones, ur: NEGATIVE mg/dL
Nitrite: NEGATIVE
Protein, ur: NEGATIVE mg/dL
Specific Gravity, Urine: 1.025 (ref 1.005–1.030)
WBC, UA: >50 WBC/hpf — ABNORMAL HIGH (ref 0–5)
pH: 5 (ref 5.0–8.0)

## 2018-07-01 LAB — COMPREHENSIVE METABOLIC PANEL
ALK PHOS: 106 U/L (ref 38–126)
ALT: 19 U/L (ref 0–44)
AST: 25 U/L (ref 15–41)
Albumin: 3.9 g/dL (ref 3.5–5.0)
Anion gap: 11 (ref 5–15)
BUN: 14 mg/dL (ref 6–20)
CO2: 24 mmol/L (ref 22–32)
CREATININE: 0.67 mg/dL (ref 0.44–1.00)
Calcium: 8.6 mg/dL — ABNORMAL LOW (ref 8.9–10.3)
Chloride: 105 mmol/L (ref 98–111)
GFR calc Af Amer: >60 mL/min (ref >60)
GFR calc non Af Amer: >60 mL/min (ref >60)
Glucose, Bld: 87 mg/dL (ref 70–99)
Potassium: 3.9 mmol/L (ref 3.5–5.1)
Sodium: 140 mmol/L (ref 135–145)
Total Bilirubin: 0.5 mg/dL (ref 0.3–1.2)
Total Protein: 8.9 g/dL — ABNORMAL HIGH (ref 6.5–8.1)

## 2018-07-01 LAB — I-STAT BETA HCG BLOOD, ED (MC, WL, AP ONLY): I-stat hCG, quantitative: <5 m[IU]/mL (ref <5)

## 2018-07-01 MED ORDER — SODIUM CHLORIDE 0.9 % IV BOLUS (SEPSIS)
1000.0000 mL | Freq: Once | INTRAVENOUS | Status: AC
  Administered 2018-07-01: 1000 mL via INTRAVENOUS

## 2018-07-01 MED ORDER — CEPHALEXIN 500 MG PO CAPS: 500.0000 mg | ORAL_CAPSULE | Freq: Four times a day (QID) | ORAL | 0 refills | Status: DC

## 2018-07-01 MED ORDER — SODIUM CHLORIDE 0.9 % IV SOLN: 1000.0000 mL | INTRAVENOUS | Status: DC

## 2018-07-01 MED ORDER — ACETAMINOPHEN 500 MG PO TABS: 1000.0000 mg | ORAL_TABLET | Freq: Once | ORAL | Status: DC

## 2018-07-01 MED ORDER — CEPHALEXIN 500 MG PO CAPS
500.0000 mg | ORAL_CAPSULE | Freq: Once | ORAL | Status: AC
  Administered 2018-07-01: 500 mg via ORAL
  Filled 2018-07-01: qty 1

## 2018-07-01 MED ORDER — OSELTAMIVIR PHOSPHATE 75 MG PO CAPS: 75.0000 mg | ORAL_CAPSULE | Freq: Once | ORAL | Status: DC

## 2018-07-01 NOTE — ED Triage Notes (Signed)
Pt reports has history of tubal ligation.  Reports LMP was in nov.  Pt says she had a positive home pregnancy test in Dec.  Reports started having pain and heavy vaginal bleeding yesterday.

## 2018-07-01 NOTE — ED Notes (Signed)
Multiple unsuccessful attempts made for IV start

## 2018-07-01 NOTE — Discharge Instructions (Signed)
Your vital signs are within normal limits.  Your oxygen level is 100% on room air.  Your hemoglobin, hematocrit, complete blood count, and chemistries are nonacute.  The ultrasound of your pelvis shows no cause for the bleeding identified at this time.  There is noted a 7 x 7 x 8 cm cyst on the right ovary side.  Please see your GYN physician concerning the bleeding as well as concerning the cyst on the ovary.  Please increase fluids.  Please return to the emergency department if the bleeding gets worse before you were seen by the GYN specialist. Urine studies did show a urinary tract infection.  Please use Keflex with breakfast, lunch, dinner, and at bedtime until all taken.

## 2018-07-01 NOTE — ED Provider Notes (Signed)
[facility] EMERGENCY DEPARTMENT Provider Note   CSN: [id number] Arrival date & time: 07/01/18  [id number]     History   Chief Complaint Chief Complaint  Patient presents with  . Vaginal Bleeding    HPI Sherri Campbell is a 38 y.o. female.  Patient is a 38 year old female who presents to the emergency department with a complaint of vaginal bleeding.  The patient states that she had a tubal ligation in 2008.  She has had ovarian surgery in the past because of cyst.  Patient states that on yesterday January 23 she began to have some heavy bleeding including passing clots.  She used total of 3 pads yesterday which is much different from her usual cycle.  The patient denies being on any blood thinners.  She has no history of any bleeding disorders.  She has a history of anemia.  The last menstrual cycle was on November 1 of 2019.  The patient has a history as noted above ovarian cyst patient also states that she has been having some increased urine frequency, but no dysuria reported.  No recent injury to the lower abdomen or pelvis area.  No unusual vaginal discharge.  No known trauma to the vaginal area.  Patient presents now for assistance with this issue.  The history is provided by the patient.    Past Medical History:  Diagnosis Date  . ADHD (attention deficit hyperactivity disorder)   . Asthma   . Hypertension   . Seasonal allergies     Patient Active Problem List   Diagnosis Date Noted  . Anemia 01/18/2016  . Prediabetes 01/16/2016  . PCOS (polycystic ovarian syndrome) 02/18/2015  . Irregular bleeding 02/18/2015  . Well woman exam 02/18/2015  . Chest pain, unspecified 10/19/2012  . Essential hypertension 01/31/2010  . HIRSUTISM 01/31/2010  . Obesity 07/12/2008  . ALLERGIC RHINITIS 11/10/2006  . Asthma 08/05/2006    Past Surgical History:  Procedure Laterality Date  . ovarian cyst removed    . TONSILLECTOMY    . TUBAL LIGATION       OB History   No obstetric  history on file.      Home Medications    Prior to Admission medications   Medication Sig Start Date End Date Taking? Authorizing Provider  FLUoxetine (PROZAC) 20 MG tablet Take 20 mg by mouth daily.    [provider]  mupirocin cream (BACTROBAN) 2 % Apply 1 application topically 2 (two) times daily. 06/18/17   Elson AreasSofia, Leslie K, PA-C    Family History Family History  Problem Relation Age of Onset  . Heart disease Mother     Social History Social History   Tobacco Use  . Smoking status: Never Smoker  . Smokeless tobacco: Never Used  Substance Use Topics  . Alcohol use: Yes    Comment: occ  . Drug use: No     Allergies   Penicillins   Review of Systems Review of Systems  Constitutional: Negative for activity change.       All ROS Neg except as noted in HPI  HENT: Negative for nosebleeds.   Eyes: Negative for photophobia and discharge.  Respiratory: Negative for cough, shortness of breath and wheezing.   Cardiovascular: Negative for chest pain and palpitations.  Gastrointestinal: Positive for abdominal pain. Negative for blood in stool.  Genitourinary: Positive for frequency and vaginal bleeding. Negative for dysuria and hematuria.  Musculoskeletal: Negative for arthralgias, back pain and neck pain.  Skin: Negative.   Neurological: Negative  for dizziness, seizures and speech difficulty.  Psychiatric/Behavioral: Negative for confusion and hallucinations.     Physical Exam Updated Vital Signs BP (!) 153/95   Pulse 82   Temp 97.8 F (36.6 C)   Ht 5\' 9"  (1.753 m)   Wt 90.7 kg   LMP 04/08/2018   SpO2 100%   BMI 29.53 kg/m   Physical Exam Vitals signs and nursing note reviewed.  Constitutional:      Appearance: She is well-developed. She is not toxic-appearing.  HENT:     Head: Normocephalic.     Right Ear: Tympanic membrane and external ear normal.     Left Ear: Tympanic membrane and external ear normal.  Eyes:     General: Lids are normal.       Pupils: Pupils are equal, round, and reactive to light.  Neck:     Musculoskeletal: Normal range of motion and neck supple.     Vascular: No carotid bruit.  Cardiovascular:     Rate and Rhythm: Normal rate and regular rhythm.     Pulses: Normal pulses.     Heart sounds: Normal heart sounds.  Pulmonary:     Effort: No respiratory distress.     Breath sounds: Normal breath sounds.  Abdominal:     General: Bowel sounds are normal.     Palpations: Abdomen is soft.     Tenderness: There is abdominal tenderness in the epigastric area and suprapubic area. There is no guarding.  Musculoskeletal: Normal range of motion.  Lymphadenopathy:     Head:     Right side of head: No submandibular adenopathy.     Left side of head: No submandibular adenopathy.     Cervical: No cervical adenopathy.  Skin:    General: Skin is warm and dry.     Capillary Refill: Capillary refill takes less than 2 seconds.     Coloration: Skin is not cyanotic, jaundiced, mottled or pale.  Neurological:     Mental Status: She is alert and oriented to person, place, and time.     Cranial Nerves: No cranial nerve deficit.     Sensory: No sensory deficit.  Psychiatric:        Speech: Speech normal.      ED Treatments / Results  Labs (all labs ordered are listed, but only abnormal results are displayed) Labs Reviewed - No data to display  EKG None  Radiology No results found.  Procedures Procedures (including critical care time)  Medications Ordered in ED Medications - No data to display   Initial Impression / Assessment and Plan / ED Course  I have reviewed the triage vital signs and the nursing notes.  Pertinent labs & imaging results that were available during my care of the patient were reviewed by me and considered in my medical decision making (see chart for details).       Final Clinical Impressions(s) / ED Diagnoses MDM  Blood pressure is slightly elevated 153/95, otherwise vital  signs within normal limits.  Pulse oximetry is 100% on room air.  Within normal limits by my interpretation.  10:27 There is a delay in obtaining the blood and  the lab results.  Comprehensive metabolic panel is negative for acute changes. Beta-hCG is negative for pregnancy. Urine analysis is consistent with a urinary tract infection.  Culture sent to the lab. Ultrasound transvaginal shows no evidence of endometrial thickness.  There is a 7 x 7 x 8 cm simple cyst adjacent to or arising from  the right ovary.  It is recommended that the patient have a repeat ultrasound in between 2 and 6 months to reevaluate this area.  Patient referred to [facility] -[facility]cology.  Patient advised to increase fluids.  Patient placed on Keflex for the infection.  Patient is to be rechecked for resolution of the urinary tract infection in 7 to 10 days.  Patient is to return to the emergency department immediately if the bleeding is not improving, fever, increasing pain, changes in condition, problems, or concerns.    Final diagnoses:  Vaginal bleeding, abnormal  Urinary tract infection without hematuria, site unspecified    ED Discharge Orders         Ordered    cephALEXin (KEFLEX) 500 MG capsule  4 times daily     07/01/18 1338           Ivery Quale, PA-C 07/04/18 2203    Sabas Sous, MD 07/05/18 [phone]

## 2018-07-05 LAB — URINE CULTURE
Culture: 70000 — AB
Special Requests: NORMAL

## 2018-07-06 ENCOUNTER — Telehealth: Payer: Self-pay

## 2018-07-06 NOTE — Telephone Encounter (Signed)
Post ED Visit - Positive Culture Follow-up  Culture report reviewed by antimicrobial stewardship pharmacist:  []  Enzo Bi, Pharm.D. []  Celedonio Miyamoto, Pharm.D., BCPS AQ-ID []  Garvin Fila, Pharm.D., BCPS []  Georgina Pillion, Pharm.D., BCPS []  [place], [address].D., BCPS, AAHIVP []  Estella Husk, Pharm.D., BCPS, AAHIVP [x]  Lysle Pearl, PharmD, BCPS []  Phillips Climes, PharmD, BCPS []  Agapito Games, PharmD, BCPS []  Verlan Friends, PharmD  Positive urine culture Treated with Cephalexin, organism sensitive to the same and no further patient follow-up is required at this time.  Jerry Caras 07/06/2018, 9:09 AM

## 2018-12-06 ENCOUNTER — Ambulatory Visit: Payer: Medicaid Other | Admitting: Family Medicine

## 2018-12-06 ENCOUNTER — Other Ambulatory Visit: Payer: Self-pay

## 2018-12-06 ENCOUNTER — Encounter: Payer: Self-pay | Admitting: Family Medicine

## 2018-12-06 VITALS — BP 140/90 | HR 84 | Ht 69.0 in | Wt 245.4 lb

## 2018-12-06 DIAGNOSIS — I10 Essential (primary) hypertension: Secondary | ICD-10-CM

## 2018-12-06 DIAGNOSIS — H538 Other visual disturbances: Secondary | ICD-10-CM | POA: Diagnosis not present

## 2018-12-06 DIAGNOSIS — E6609 Other obesity due to excess calories: Secondary | ICD-10-CM

## 2018-12-06 DIAGNOSIS — D649 Anemia, unspecified: Secondary | ICD-10-CM

## 2018-12-06 DIAGNOSIS — R7303 Prediabetes: Secondary | ICD-10-CM

## 2018-12-06 DIAGNOSIS — F419 Anxiety disorder, unspecified: Secondary | ICD-10-CM | POA: Diagnosis not present

## 2018-12-06 DIAGNOSIS — F418 Other specified anxiety disorders: Secondary | ICD-10-CM | POA: Insufficient documentation

## 2018-12-06 DIAGNOSIS — Z6836 Body mass index (BMI) 36.0-36.9, adult: Secondary | ICD-10-CM

## 2018-12-06 LAB — POCT GLYCOSYLATED HEMOGLOBIN (HGB A1C): Hemoglobin A1C: 5.4 % (ref 4.0–5.6)

## 2018-12-06 MED ORDER — HYDROCHLOROTHIAZIDE 12.5 MG PO CAPS: 12.5000 mg | ORAL_CAPSULE | Freq: Every day | ORAL | 0 refills | Status: DC

## 2018-12-06 MED ORDER — FLUOXETINE HCL 20 MG PO TABS: 20.0000 mg | ORAL_TABLET | Freq: Every day | ORAL | 0 refills | Status: DC

## 2018-12-06 MED ORDER — ALBUTEROL SULFATE HFA 108 (90 BASE) MCG/ACT IN AERS: 2.0000 | INHALATION_SPRAY | RESPIRATORY_TRACT | 1 refills | Status: DC | PRN

## 2018-12-06 NOTE — Progress Notes (Signed)
Subjective:   Patient ID: Sherri Campbell    DOB: 12/18/1980, 38 y.o. female   MRN: [id number]  Sherri Campbell is a 38 y.o. female with a history of HTN, allergies, asthma, PCOS, hirsutism, obesity, prediabtes here for   Hypertension: - Medications: none currently (had issues with transportation to appts when in shelter), previously on HCTZ, lisinopril. - Compliance: N/A - Checking BP at home: 180 SBP recently - Denies any SOB or symptoms of hypotension - Diet: snacks throughout the day. No pork or salt. Eats vegetables. - Exercise: walking, dancing on TikTok, cleaning - has had chest pain in the past, but attributes this to anxiety. - some blurry vision, headaches and LE edema   Anxiety - Medications: prozac 20mg , hasn't had in some time. - Taking: none  - Current stressors: previous housing instability, boyfriend has family issues. Currently working on getting joint custody of daughter. - Coping Mechanisms: listening to music, taking a walk. Daughter likes to do her nails. - seeing counselor - prior h/o ADHD and OCD as a child, previously on concerta.  Review of Systems:  Per HPI.  PMFSH, medications and smoking status reviewed.  Objective:   BP 140/90   Pulse 84   Ht 5\' 9"  (1.753 m)   Wt 245 lb 6 oz (111.3 kg)   LMP 12/02/2018   SpO2 97%   BMI 36.24 kg/m  Vitals and nursing note reviewed.  General: obese female, in no acute distress with non-toxic appearance CV: regular rate and rhythm without murmurs, rubs, or gallops, no lower extremity edema Lungs: clear to auscultation bilaterally with normal work of breathing Skin: warm, dry Extremities: warm and well perfused, normal tone MSK: ROM grossly intact, strength intact, gait normal Neuro: Alert and oriented, speech normal  GAD 7 : Generalized Anxiety Score 12/06/2018 12/06/2018  Nervous, Anxious, on Edge 3 3  Control/stop worrying 1 1  Worry too much - different things 2 2  Trouble relaxing 2 2  Restless 2 2  Easily  annoyed or irritable 3 3  Afraid - awful might happen 0 0  Total GAD 7 Score 13 13  Anxiety Difficulty Somewhat difficult Not difficult at all    Assessment & Plan:   Essential hypertension Elevated at 150/90, 140/90 on recheck. Previously on ACEI and thiazide. Will refill thiazide diuretic. Will check BMP. Counseled on diet, exercise, DASH diet. Handout provided. F/u in one month.  Prediabetes Will recheck A1c. Counseling provided on diet and exercise.  Obesity Contributing to HTN and prediabetes. Counseling provided on weight loss through diet and exercise.  Anxiety GAD 13 today. Previously well controlled on prozac but has not had in some time, refill provided. Currently seeing counselor. Reviewed coping mechanisms. Follow up in one month.  Orders Placed This Encounter  Procedures  . Basic Metabolic Panel  . CBC  . Ambulatory referral to Optometry    Referral Priority:   Routine    Referral Type:   Vision Training and development officer(Optometry)    Referral Reason:   Specialty Services Required    Requested Specialty:   Optometry    Number of Visits Requested:   1  . HgB A1c   Meds ordered this encounter  Medications  . albuterol (VENTOLIN HFA) 108 (90 Base) MCG/ACT inhaler    Sig: Inhale 2 puffs into the lungs every 4 (four) hours as needed for wheezing or shortness of breath.    Dispense:  8 g    Refill:  1  . FLUoxetine (PROZAC) 20 MG  tablet    Sig: Take 1 tablet (20 mg total) by mouth daily.    Dispense:  90 tablet    Refill:  0  . hydrochlorothiazide (MICROZIDE) 12.5 MG capsule    Sig: Take 1 capsule (12.5 mg total) by mouth daily.    Dispense:  90 capsule    Refill:  0    Sherri Percy, DO PGY-2, [facility] 12/06/2018 4:53 PM

## 2018-12-06 NOTE — Assessment & Plan Note (Signed)
Will recheck A1c. Counseling provided on diet and exercise.

## 2018-12-06 NOTE — Assessment & Plan Note (Signed)
GAD 13 today. Previously well controlled on prozac but has not had in some time, refill provided. Currently seeing counselor. Reviewed coping mechanisms. Follow up in one month.

## 2018-12-06 NOTE — Assessment & Plan Note (Addendum)
Elevated at 150/90, 140/90 on recheck. Previously on ACEI and thiazide. Will refill thiazide diuretic. Will check BMP. Counseled on diet, exercise, DASH diet. Handout provided. F/u in one month.

## 2018-12-06 NOTE — Assessment & Plan Note (Signed)
Contributing to HTN and prediabetes. Counseling provided on weight loss through diet and exercise.

## 2018-12-06 NOTE — Patient Instructions (Signed)
It was great to see you!  Our plans for today:  - We started medications for anxiety and blood pressure. - Continue to see your counselor. - Come back in one month for follow up.  We are checking some labs today, we will call you or send you a letter if they are abnormal.   Take care and seek immediate care sooner if you develop any concerns.   Dr. Mollie Germanyumball [facility]   Mental Health Apps and Websites Here are a few free apps meant to help you to help yourself.  To find, try searching on the internet to see if the app is offered on Apple/Android devices. If your first choice doesn't come up on your device, the good news is that there are many choices! Play around with different apps to see which ones are helpful to you . Calm This is an app meant to help increase calm feelings. Includes info, strategies, and tools for tracking your feelings.   Calm Harm  This app is meant to help with self-harm. Provides many 5-minute or 15-min coping strategies for doing instead of hurting yourself.    Healthy Minds Health Minds is a problem-solving tool to help deal with emotions and cope with stress you encounter wherever you are.    MindShift This app can help people cope with anxiety. Rather than trying to avoid anxiety, you can make an important shift and face it.    MY3  MY3 features a support system, safety plan and resources with the goal of offering a tool to use in a time of need.    My Life My Voice  This mood journal offers a simple solution for tracking your thoughts, feelings and moods. Animated emoticons can help identify your mood.   Relax Melodies Designed to help with sleep, on this app you can mix sounds and meditations for relaxation.    Smiling Mind Smiling Mind is meditation made easy: it's a simple tool that helps put a smile on your mind.    Stop, Breathe & Think  A friendly, simple guide for people through meditations for mindfulness and compassion.  Stop,  Breathe and Think Kids Enter your current feelings and choose a "mission" to help you cope. Offers videos for certain moods instead of just sound recordings.     The [employer] Station[employer]Virtual Hope Box The United StationersVirtual Hope Box (VHB) contains simple tools to help patients with coping, relaxation, distraction, and positive thinking.    DASH Eating Plan DASH stands for "Dietary Approaches to Stop Hypertension." The DASH eating plan is a healthy eating plan that has been shown to reduce high blood pressure (hypertension). It may also reduce your risk for type 2 diabetes, heart disease, and stroke. The DASH eating plan may also help with weight loss. What are tips for following this plan?  General guidelines  Avoid eating more than 2,300 mg (milligrams) of salt (sodium) a day. If you have hypertension, you may need to reduce your sodium intake to 1,500 mg a day.  Limit alcohol intake to no more than 1 drink a day for nonpregnant women and 2 drinks a day for men. One drink equals 12 oz of beer, 5 oz of wine, or 1 oz of hard liquor.  Work with your health care provider to maintain a healthy body weight or to lose weight. Ask what an ideal weight is for you.  Get at least 30 minutes of exercise that causes your heart to beat faster (aerobic exercise) most  days of the week. Activities may include walking, swimming, or biking.  Work with your health care provider or diet and nutrition specialist (dietitian) to adjust your eating plan to your individual calorie needs. Reading food labels   Check food labels for the amount of sodium per serving. Choose foods with less than 5 percent of the Daily Value of sodium. Generally, foods with less than 300 mg of sodium per serving fit into this eating plan.  To find whole grains, look for the word "whole" as the first word in the ingredient list. Shopping  Buy products labeled as "low-sodium" or "no salt added."  Buy fresh foods. Avoid canned foods and premade or frozen meals.  Cooking  Avoid adding salt when cooking. Use salt-free seasonings or herbs instead of table salt or sea salt. Check with your health care provider or pharmacist before using salt substitutes.  Do not fry foods. Cook foods using healthy methods such as baking, boiling, grilling, and broiling instead.  Cook with heart-healthy oils, such as olive, canola, soybean, or sunflower oil. Meal planning  Eat a balanced diet that includes: ? 5 or more servings of fruits and vegetables each day. At each meal, try to fill half of your plate with fruits and vegetables. ? Up to 6-8 servings of whole grains each day. ? Less than 6 oz of lean meat, poultry, or fish each day. A 3-oz serving of meat is about the same size as a deck of cards. One egg equals 1 oz. ? 2 servings of low-fat dairy each day. ? A serving of nuts, seeds, or beans 5 times each week. ? Heart-healthy fats. Healthy fats called Omega-3 fatty acids are found in foods such as flaxseeds and coldwater fish, like sardines, salmon, and mackerel.  Limit how much you eat of the following: ? Canned or prepackaged foods. ? Food that is high in trans fat, such as fried foods. ? Food that is high in saturated fat, such as fatty meat. ? Sweets, desserts, sugary drinks, and other foods with added sugar. ? Full-fat dairy products.  Do not salt foods before eating.  Try to eat at least 2 vegetarian meals each week.  Eat more home-cooked food and less restaurant, buffet, and fast food.  When eating at a restaurant, ask that your food be prepared with less salt or no salt, if possible. What foods are recommended? The items listed may not be a complete list. Talk with your dietitian about what dietary choices are best for you. Grains Whole-grain or whole-wheat bread. Whole-grain or whole-wheat pasta. Brown rice. [place]ah Cobbatmeal. Quinoa. Bulgur. Whole-grain and low-sodium cereals. Pita bread. Low-fat, low-sodium crackers. Whole-wheat flour tortillas.  Vegetables Fresh or frozen vegetables (raw, steamed, roasted, or grilled). Low-sodium or reduced-sodium tomato and vegetable juice. Low-sodium or reduced-sodium tomato sauce and tomato paste. Low-sodium or reduced-sodium canned vegetables. Fruits All fresh, dried, or frozen fruit. Canned fruit in natural juice (without added sugar). Meat and other protein foods Skinless chicken or [place]. Ground chicken or [place]. Pork with fat trimmed off. Fish and seafood. Egg whites. Dried beans, peas, or lentils. Unsalted nuts, nut butters, and seeds. Unsalted canned beans. Lean cuts of beef with fat trimmed off. Low-sodium, lean deli meat. Dairy Low-fat (1%) or fat-free (skim) milk. Fat-free, low-fat, or reduced-fat cheeses. Nonfat, low-sodium ricotta or cottage cheese. Low-fat or nonfat yogurt. Low-fat, low-sodium cheese. Fats and oils Soft margarine without trans fats. Vegetable oil. Low-fat, reduced-fat, or light mayonnaise and salad dressings (reduced-sodium). Canola, safflower, olive, soybean,  and sunflower oils. Avocado. Seasoning and other foods Herbs. Spices. Seasoning mixes without salt. Unsalted popcorn and pretzels. Fat-free sweets. What foods are not recommended? The items listed may not be a complete list. Talk with your dietitian about what dietary choices are best for you. Grains Baked goods made with fat, such as croissants, muffins, or some breads. Dry pasta or rice meal packs. Vegetables Creamed or fried vegetables. Vegetables in a cheese sauce. Regular canned vegetables (not low-sodium or reduced-sodium). Regular canned tomato sauce and paste (not low-sodium or reduced-sodium). Regular tomato and vegetable juice (not low-sodium or reduced-sodium). Angie Fava. Olives. Fruits Canned fruit in a light or heavy syrup. Fried fruit. Fruit in cream or butter sauce. Meat and other protein foods Fatty cuts of meat. Ribs. Fried meat. Berniece Salines. Sausage. Bologna and other processed lunch meats. Salami.  Fatback. Hotdogs. Bratwurst. Salted nuts and seeds. Canned beans with added salt. Canned or smoked fish. Whole eggs or egg yolks. Chicken or [place] with skin. Dairy Whole or 2% milk, cream, and half-and-half. Whole or full-fat cream cheese. Whole-fat or sweetened yogurt. Full-fat cheese. Nondairy creamers. Whipped toppings. Processed cheese and cheese spreads. Fats and oils Butter. Stick margarine. Lard. Shortening. Ghee. Bacon fat. Tropical oils, such as coconut, palm kernel, or palm oil. Seasoning and other foods Salted popcorn and pretzels. Onion salt, garlic salt, seasoned salt, table salt, and sea salt. Worcestershire sauce. Tartar sauce. Barbecue sauce. Teriyaki sauce. Soy sauce, including reduced-sodium. Steak sauce. Canned and packaged gravies. Fish sauce. Oyster sauce. Cocktail sauce. Horseradish that you find on the shelf. Ketchup. Mustard. Meat flavorings and tenderizers. Bouillon cubes. Hot sauce and Tabasco sauce. Premade or packaged marinades. Premade or packaged taco seasonings. Relishes. Regular salad dressings. Where to find more information:  National Heart, Lung, and [place]: https://wilson-eaton.com/  American Heart Association: www.heart.org Summary  The DASH eating plan is a healthy eating plan that has been shown to reduce high blood pressure (hypertension). It may also reduce your risk for type 2 diabetes, heart disease, and stroke.  With the DASH eating plan, you should limit salt (sodium) intake to 2,300 mg a day. If you have hypertension, you may need to reduce your sodium intake to 1,500 mg a day.  When on the DASH eating plan, aim to eat more fresh fruits and vegetables, whole grains, lean proteins, low-fat dairy, and heart-healthy fats.  Work with your health care provider or diet and nutrition specialist (dietitian) to adjust your eating plan to your individual calorie needs. This information is not intended to replace advice given to you by your health care  provider. Make sure you discuss any questions you have with your health care provider. Document Released: 05/14/2011 Document Revised: 05/07/2017 Document Reviewed: 05/18/2016 Elsevier Patient Education  2020 [employer].

## 2018-12-07 LAB — BASIC METABOLIC PANEL
BUN/Creatinine Ratio: 13 (ref 9–23)
BUN: 10 mg/dL (ref 6–20)
CO2: 22 mmol/L (ref 20–29)
Calcium: 8.5 mg/dL — ABNORMAL LOW (ref 8.7–10.2)
Chloride: 102 mmol/L (ref 96–106)
Creatinine, Ser: 0.79 mg/dL (ref 0.57–1.00)
GFR calc Af Amer: 111 mL/min/{1.73_m2} (ref >59)
GFR calc non Af Amer: 96 mL/min/{1.73_m2} (ref >59)
Glucose: 98 mg/dL (ref 65–99)
Potassium: 4.6 mmol/L (ref 3.5–5.2)
Sodium: 139 mmol/L (ref 134–144)

## 2018-12-07 LAB — CBC
Hematocrit: 24.5 % — ABNORMAL LOW (ref 34.0–46.6)
Hemoglobin: 6.5 g/dL — CL (ref 11.1–15.9)
MCH: 16.3 pg — ABNORMAL LOW (ref 26.6–33.0)
MCHC: 26.5 g/dL — ABNORMAL LOW (ref 31.5–35.7)
MCV: 62 fL — ABNORMAL LOW (ref 79–97)
Platelets: 582 10*3/uL — ABNORMAL HIGH (ref 150–450)
RBC: 3.98 x10E6/uL (ref 3.77–5.28)
RDW: 21 % — ABNORMAL HIGH (ref 11.7–15.4)
WBC: 5.6 10*3/uL (ref 3.4–10.8)

## 2018-12-13 ENCOUNTER — Encounter: Payer: Self-pay | Admitting: *Deleted

## 2018-12-28 ENCOUNTER — Telehealth: Payer: Self-pay | Admitting: Family Medicine

## 2018-12-28 NOTE — Telephone Encounter (Signed)
Have tried multiple attempts to reach patient by phone 7/2, 7/7, 7/8, 7/22 to follow up previous abnormal labs (most notably Hb 6.5) but have been unable to reach her. Have left generic voicemail given name on voicemail is "[facility]." Letter mailed to patient 7/7. No other visits documented since most recent visit 6/30.

## 2019-04-28 ENCOUNTER — Other Ambulatory Visit: Payer: Self-pay | Admitting: Family Medicine

## 2019-04-28 NOTE — Telephone Encounter (Signed)
Refill sent. Please have patient make appointment for follow up for anxiety.

## 2019-05-01 NOTE — Telephone Encounter (Signed)
Attempted to call patient to schedule an appointment but had to leave voicemail and not sure if this is even her number anymore as there was a female on the voicemail.  Will mail a letter asking her to call office to schedule an appointment.  Jazmin Hartsell,CMA

## 2019-05-15 ENCOUNTER — Telehealth (INDEPENDENT_AMBULATORY_CARE_PROVIDER_SITE_OTHER): Payer: Medicaid Other | Admitting: Family Medicine

## 2019-05-15 ENCOUNTER — Other Ambulatory Visit: Payer: Self-pay

## 2019-05-15 NOTE — Progress Notes (Signed)
Attempted to contact patient multiple times on different phone numbers provided. Initial number patient left for today's visit states "person not able to receive calls." Other number provided in patient's chart has different name listed on voicemail. Per chart review, have had difficulties reaching patient in the past. Please obtain accurate number from patient if/when she calls back.  Will be no charge for this visit.  Rory Percy, DO PGY-3, [facility] 05/15/2019 2:49 PM

## 2019-05-29 ENCOUNTER — Telehealth (INDEPENDENT_AMBULATORY_CARE_PROVIDER_SITE_OTHER): Payer: Medicaid Other | Admitting: Family Medicine

## 2019-05-29 ENCOUNTER — Other Ambulatory Visit: Payer: Self-pay

## 2019-05-29 DIAGNOSIS — D649 Anemia, unspecified: Secondary | ICD-10-CM

## 2019-05-29 NOTE — Progress Notes (Signed)
Called patient multiple times on different phone numbers, however no answer or ability to leave voicemail.  No charge for visit.

## 2019-10-30 ENCOUNTER — Other Ambulatory Visit: Payer: Self-pay | Admitting: Family Medicine

## 2019-10-31 ENCOUNTER — Other Ambulatory Visit: Payer: Self-pay | Admitting: Family Medicine

## 2020-06-11 ENCOUNTER — Other Ambulatory Visit: Payer: Self-pay | Admitting: Family Medicine

## 2020-06-11 NOTE — Telephone Encounter (Signed)
Routing to PCP

## 2020-07-15 ENCOUNTER — Ambulatory Visit (INDEPENDENT_AMBULATORY_CARE_PROVIDER_SITE_OTHER): Payer: Medicaid Other | Admitting: Internal Medicine

## 2020-07-25 ENCOUNTER — Encounter (INDEPENDENT_AMBULATORY_CARE_PROVIDER_SITE_OTHER): Payer: Self-pay | Admitting: Internal Medicine

## 2020-07-25 ENCOUNTER — Ambulatory Visit (INDEPENDENT_AMBULATORY_CARE_PROVIDER_SITE_OTHER): Payer: Medicaid Other | Admitting: Internal Medicine

## 2020-07-25 ENCOUNTER — Other Ambulatory Visit: Payer: Self-pay

## 2020-07-25 VITALS — BP 150/110 | HR 86 | Ht 69.0 in | Wt 283.0 lb

## 2020-07-25 DIAGNOSIS — F419 Anxiety disorder, unspecified: Secondary | ICD-10-CM

## 2020-07-25 DIAGNOSIS — R7303 Prediabetes: Secondary | ICD-10-CM

## 2020-07-25 DIAGNOSIS — E282 Polycystic ovarian syndrome: Secondary | ICD-10-CM | POA: Diagnosis not present

## 2020-07-25 DIAGNOSIS — E785 Hyperlipidemia, unspecified: Secondary | ICD-10-CM | POA: Diagnosis not present

## 2020-07-25 DIAGNOSIS — E559 Vitamin D deficiency, unspecified: Secondary | ICD-10-CM

## 2020-07-25 DIAGNOSIS — D649 Anemia, unspecified: Secondary | ICD-10-CM

## 2020-07-25 DIAGNOSIS — I10 Essential (primary) hypertension: Secondary | ICD-10-CM | POA: Diagnosis not present

## 2020-07-25 LAB — CBC: Platelets: 595 10*3/uL — ABNORMAL HIGH (ref 140–400)

## 2020-07-25 MED ORDER — HYDROCHLOROTHIAZIDE 12.5 MG PO CAPS: 12.5000 mg | ORAL_CAPSULE | Freq: Every day | ORAL | 3 refills | Status: DC

## 2020-07-25 MED ORDER — FLUOXETINE HCL 20 MG PO CAPS: 20.0000 mg | ORAL_CAPSULE | Freq: Every day | ORAL | 3 refills | Status: DC

## 2020-07-25 NOTE — Progress Notes (Addendum)
Metrics: Intervention Frequency ACO  Documented Smoking Status Yearly  Screened one or more times in 24 months  Cessation Counseling or  Active cessation medication Past 24 months  Past 24 months   Guideline developer: UpToDate (See UpToDate for funding source) Date Released: 2014       Wellness Office Visit  Subjective:  Patient ID: Sherri Campbell, female    DOB: 05-02-1981  Age: 40 y.o. MRN: [id number]  CC: This 40 year old lady comes to our practice to establish care. HPI  She has a history of hypertension, dyslipidemia, prediabetes, morbid obesity and of course all this stems from her previous diagnosis of PCOS.  She has never been told that she has PCOS but there has been notations in the chart made.  She certainly describes several miscarriages in the past, hirsutism and acne as well as obesity. She has anxiety and depression and she says that she has been told she has ADHD and possibly bipolar disorder.  I am not sure that this has been all sorted out. She has not taken her antihypertensive medications as she ran out of hydrochlorothiazide and has not been taking her Prozac also.  She needs refill on these medications now. Past Medical History:  Diagnosis Date  . ADHD (attention deficit hyperactivity disorder)   . Asthma   . Depression   . Hypertension   . Seasonal allergies    Past Surgical History:  Procedure Laterality Date  . ovarian cyst removed    . TONSILLECTOMY    . TUBAL LIGATION       Family History  Problem Relation Age of Onset  . Heart disease Mother   . Hypertension Mother   . Diabetes Father   . Hypertension Father   . Diabetes Brother     Social History   Social History Narrative   Engaged,lives with fiancee and one daughter.Unemployed.   Social History   Tobacco Use  . Smoking status: Never Smoker  . Smokeless tobacco: Never Used  Substance Use Topics  . Alcohol use: Yes    Comment: occ    Current Meds  Medication Sig  . albuterol  (VENTOLIN HFA) 108 (90 Base) MCG/ACT inhaler Inhale 2 puffs into the lungs every 4 (four) hours as needed for wheezing or shortness of breath.  . [DISCONTINUED] FLUoxetine (PROZAC) 20 MG capsule TAKE 1 TABLET(20 MG) BY MOUTH DAILY  . [DISCONTINUED] hydrochlorothiazide (MICROZIDE) 12.5 MG capsule TAKE 1 CAPSULE(12.5 MG) BY MOUTH DAILY       Objective:   Today's Vitals: BP (!) 150/110   Pulse 86   Ht 5\' 9"  (1.753 m)   Wt 283 lb (128.4 kg)   SpO2 97%   BMI 41.79 kg/m  Vitals with BMI 07/25/2020 12/06/2018 12/06/2018  Height 5\' 9"  - 5\' 9"   Weight 283 lbs - 245 lbs 6 oz  BMI 41.77 - 36.22  Systolic 150 140 12/08/2018  Diastolic 110 90 90  Pulse 86 - 84     Physical Exam  She is morbidly obese.  Blood pressure is elevated.  She is alert and orientated without any focal logical signs.     Assessment   1. PCOS (polycystic ovarian syndrome)   2. Essential hypertension   3. Anxiety   4. Dyslipidemia   5. Anemia, unspecified type   6. Prediabetes   7. Vitamin D deficiency disease       Tests ordered Orders Placed This Encounter  Procedures  . CBC  . COMPLETE METABOLIC PANEL WITH GFR  .  Hemoglobin A1c  . Lipid panel  . Luteinizing hormone  . Follicle stimulating hormone  . T3, free  . T4, free  . TSH  . VITAMIN D 25 Hydroxy (Vit-D Deficiency, Fractures)  . Testos,Total,Free and SHBG (Female)     Plan: 1. I will refill hydrochlorothiazide to get her blood pressure under better control. 2. I will refill Paxil for anxiety/depression. 3. Blood work is ordered to confirm the diagnosis of PCOS as well as look at her lipid panel, hemoglobin A1c.  We will check thyroid function also. 4. Follow-up in about 2 to 3 weeks time to review all the blood work and make sure blood pressure is coming under better control and discuss in more detail PCOS which I briefly did today.   Meds ordered this encounter  Medications  . hydrochlorothiazide (MICROZIDE) 12.5 MG capsule    Sig: Take  1 capsule (12.5 mg total) by mouth daily.    Dispense:  30 capsule    Refill:  3  . FLUoxetine (PROZAC) 20 MG capsule    Sig: Take 1 capsule (20 mg total) by mouth daily.    Dispense:  30 capsule    Refill:  3    Trino Higinbotham Normajean Glasgow, MD

## 2020-07-26 LAB — LIPID PANEL: Total CHOL/HDL Ratio: 4.2 (calc) (ref <5.0)

## 2020-07-26 LAB — COMPLETE METABOLIC PANEL WITH GFR
AST: 18 U/L (ref 10–30)
Alkaline phosphatase (APISO): 91 U/L (ref 31–125)
Creat: 0.71 mg/dL (ref 0.50–1.10)

## 2020-07-29 LAB — FOLLICLE STIMULATING HORMONE: FSH: 10.7 m[IU]/mL

## 2020-07-29 LAB — TESTOS,TOTAL,FREE AND SHBG (FEMALE)
Free Testosterone: 4.4 pg/mL (ref 0.1–6.4)
Sex Hormone Binding: 45 nmol/L (ref 17–124)
Testosterone, Total, LC-MS-MS: 29 ng/dL (ref 2–45)

## 2020-07-29 LAB — LIPID PANEL
Cholesterol: 203 mg/dL — ABNORMAL HIGH (ref <200)
HDL: 48 mg/dL — ABNORMAL LOW (ref >=50)
LDL Cholesterol (Calc): 130 mg/dL (calc) — ABNORMAL HIGH
Non-HDL Cholesterol (Calc): 155 mg/dL (calc) — ABNORMAL HIGH (ref <130)
Triglycerides: 137 mg/dL (ref <150)

## 2020-07-29 LAB — COMPLETE METABOLIC PANEL WITH GFR
AG Ratio: 1.2 (calc) (ref 1.0–2.5)
ALT: 14 U/L (ref 6–29)
Albumin: 4.4 g/dL (ref 3.6–5.1)
BUN: 12 mg/dL (ref 7–25)
CO2: 25 mmol/L (ref 20–32)
Calcium: 8.7 mg/dL (ref 8.6–10.2)
Chloride: 102 mmol/L (ref 98–110)
GFR, Est African American: 124 mL/min/{1.73_m2} (ref >=60)
GFR, Est Non African American: 107 mL/min/{1.73_m2} (ref >=60)
Globulin: 3.7 g/dL (calc) (ref 1.9–3.7)
Glucose, Bld: 83 mg/dL (ref 65–139)
Potassium: 4.4 mmol/L (ref 3.5–5.3)
Sodium: 137 mmol/L (ref 135–146)
Total Bilirubin: 0.3 mg/dL (ref 0.2–1.2)
Total Protein: 8.1 g/dL (ref 6.1–8.1)

## 2020-07-29 LAB — TSH: TSH: 3.07 mIU/L

## 2020-07-29 LAB — VITAMIN D 25 HYDROXY (VIT D DEFICIENCY, FRACTURES): Vit D, 25-Hydroxy: 15 ng/mL — ABNORMAL LOW (ref 30–100)

## 2020-07-29 LAB — CBC
HCT: 26.9 % — ABNORMAL LOW (ref 35.0–45.0)
Hemoglobin: 7.1 g/dL — ABNORMAL LOW (ref 11.7–15.5)
MCH: 16.4 pg — ABNORMAL LOW (ref 27.0–33.0)
MCHC: 26.4 g/dL — ABNORMAL LOW (ref 32.0–36.0)
MCV: 62 fL — ABNORMAL LOW (ref 80.0–100.0)
MPV: 9.1 fL (ref 7.5–12.5)
RBC: 4.34 10*6/uL (ref 3.80–5.10)
RDW: 21.5 % — ABNORMAL HIGH (ref 11.0–15.0)
WBC: 5.7 10*3/uL (ref 3.8–10.8)

## 2020-07-29 LAB — T3, FREE: T3, Free: 3.1 pg/mL (ref 2.3–4.2)

## 2020-07-29 LAB — HEMOGLOBIN A1C
Hgb A1c MFr Bld: 5.4 % of total Hgb (ref <5.7)
Mean Plasma Glucose: 108 mg/dL
eAG (mmol/L): 6 mmol/L

## 2020-07-29 LAB — LUTEINIZING HORMONE: LH: 19.5 m[IU]/mL

## 2020-07-29 LAB — T4, FREE: Free T4: 1.1 ng/dL (ref 0.8–1.8)

## 2020-07-29 NOTE — Progress Notes (Signed)
Please call this patient and let her know that she is anemic still.  She needs to take over-the-counter iron supplements if she is not already doing so.  I will address all the blood results when I see her on the next visit.  Let me know.

## 2020-07-31 NOTE — Progress Notes (Signed)
Pt called and given lab result of iron is very low. Ask if she was taking supplement/. Pt was not ;but will start back today. Will be in for next appt to recheck and discuss on 3/82/2.

## 2020-08-12 ENCOUNTER — Ambulatory Visit (INDEPENDENT_AMBULATORY_CARE_PROVIDER_SITE_OTHER): Payer: Medicaid Other | Admitting: Internal Medicine

## 2020-08-15 ENCOUNTER — Ambulatory Visit (INDEPENDENT_AMBULATORY_CARE_PROVIDER_SITE_OTHER): Payer: Medicaid Other | Admitting: Internal Medicine

## 2020-08-15 ENCOUNTER — Telehealth: Payer: Self-pay | Admitting: Adult Health

## 2020-08-15 ENCOUNTER — Other Ambulatory Visit: Payer: Self-pay

## 2020-08-15 ENCOUNTER — Encounter (INDEPENDENT_AMBULATORY_CARE_PROVIDER_SITE_OTHER): Payer: Self-pay | Admitting: Internal Medicine

## 2020-08-15 VITALS — BP 142/80 | HR 79 | Temp 97.7°F | Resp 18 | Ht 69.0 in | Wt 278.8 lb

## 2020-08-15 DIAGNOSIS — E282 Polycystic ovarian syndrome: Secondary | ICD-10-CM

## 2020-08-15 DIAGNOSIS — D649 Anemia, unspecified: Secondary | ICD-10-CM | POA: Diagnosis not present

## 2020-08-15 DIAGNOSIS — N921 Excessive and frequent menstruation with irregular cycle: Secondary | ICD-10-CM | POA: Diagnosis not present

## 2020-08-15 MED ORDER — PROGESTERONE 200 MG PO CAPS: 200.0000 mg | ORAL_CAPSULE | Freq: Every day | ORAL | 3 refills | Status: DC

## 2020-08-15 MED ORDER — IRON (FERROUS SULFATE) 325 (65 FE) MG PO TABS: 1.0000 | ORAL_TABLET | Freq: Two times a day (BID) | ORAL | 3 refills | Status: DC

## 2020-08-15 NOTE — Progress Notes (Signed)
Metrics: Intervention Frequency ACO  Documented Smoking Status Yearly  Screened one or more times in 24 months  Cessation Counseling or  Active cessation medication Past 24 months  Past 24 months   Guideline developer: UpToDate (See UpToDate for funding source) Date Released: 2014       Wellness Office Visit  Subjective:  Patient ID: Sherri Campbell, female    DOB: 10-Nov-1980  Age: 40 y.o. MRN: [id number]  CC: This lady comes in for follow-up regarding her blood work. HPI  She told me that she has irregular heavy menstrual bleeding.  I discussed all the blood work with her and she has a classical flip of the FSH/LH ratio, pathopneumonic for the diagnosis of PCOS. Past Medical History:  Diagnosis Date  . ADHD (attention deficit hyperactivity disorder)   . Asthma   . Depression   . Hypertension   . Seasonal allergies    Past Surgical History:  Procedure Laterality Date  . ovarian cyst removed    . TONSILLECTOMY    . TUBAL LIGATION       Family History  Problem Relation Age of Onset  . Heart disease Mother   . Hypertension Mother   . Diabetes Father   . Hypertension Father   . Diabetes Brother     Social History   Social History Narrative   Engaged,lives with fiancee and one daughter.Unemployed.   Social History   Tobacco Use  . Smoking status: Never Smoker  . Smokeless tobacco: Never Used  Substance Use Topics  . Alcohol use: Yes    Comment: occ    Current Meds  Medication Sig  . albuterol (VENTOLIN HFA) 108 (90 Base) MCG/ACT inhaler Inhale 2 puffs into the lungs every 4 (four) hours as needed for wheezing or shortness of breath.  Marland Kitchen FLUoxetine (PROZAC) 20 MG capsule Take 1 capsule (20 mg total) by mouth daily.  . hydrochlorothiazide (MICROZIDE) 12.5 MG capsule Take 1 capsule (12.5 mg total) by mouth daily.  . Iron, Ferrous Sulfate, 325 (65 Fe) MG TABS Take 1 tablet by mouth 2 (two) times daily.  . progesterone (PROMETRIUM) 200 MG capsule Take 1 capsule (200  mg total) by mouth daily.       Objective:   Today's Vitals: BP (!) 142/80 (BP Location: Right Arm, Patient Position: Sitting, Cuff Size: Normal)   Pulse 79   Temp 97.7 F (36.5 C) (Temporal)   Resp 18   Ht 5\' 9"  (1.753 m)   Wt 278 lb 12.8 oz (126.5 kg)   BMI 41.17 kg/m  Vitals with BMI 08/15/2020 07/25/2020 12/06/2018  Height 5\' 9"  5\' 9"  -  Weight 278 lbs 13 oz 283 lbs -  BMI 41.15 41.77 -  Systolic 142 150 12/08/2018  Diastolic 80 110 90  Pulse 79 86 -     Physical Exam   She remains morbidly obese although she has lost some weight since last visit.  Blood pressure somewhat better.    Assessment   1. PCOS (polycystic ovarian syndrome)   2. Anemia, unspecified type   3. Menometrorrhagia       Tests ordered Orders Placed This Encounter  Procedures  . Ambulatory referral to Obstetrics / Gynecology     Plan: 1. I briefly explained pathophysiology of PCOS and its complications including irregular heavy bleeding, miscarriages, increased risk of uterine and breast cancer as well as increased risk of hypertension, coronary artery disease. 2. As far as her heavy bleeding is concerned, she needs to take iron  supplementation and I have sent a prescription of progesterone every night.  I also wanted to see gynecology as soon as possible for any further recommendations. 3. Follow-up with me in about a month and we will start working on improving her PCOS.   Meds ordered this encounter  Medications  . progesterone (PROMETRIUM) 200 MG capsule    Sig: Take 1 capsule (200 mg total) by mouth daily.    Dispense:  30 capsule    Refill:  3  . Iron, Ferrous Sulfate, 325 (65 Fe) MG TABS    Sig: Take 1 tablet by mouth 2 (two) times daily.    Dispense:  60 tablet    Refill:  3    Oren Barella Normajean Glasgow, MD

## 2020-08-15 NOTE — Telephone Encounter (Signed)
ERROR

## 2020-09-05 ENCOUNTER — Other Ambulatory Visit: Payer: Self-pay

## 2020-09-05 ENCOUNTER — Ambulatory Visit (INDEPENDENT_AMBULATORY_CARE_PROVIDER_SITE_OTHER): Payer: Medicaid Other | Admitting: Adult Health

## 2020-09-05 ENCOUNTER — Other Ambulatory Visit (HOSPITAL_COMMUNITY)
Admission: RE | Admit: 2020-09-05 | Discharge: 2020-09-05 | Disposition: A | Discharge status: Home / Self Care | Payer: Medicaid Other | Source: Ambulatory Visit | Attending: Adult Health | Admitting: Adult Health

## 2020-09-05 ENCOUNTER — Encounter: Payer: Self-pay | Admitting: Adult Health

## 2020-09-05 VITALS — BP 142/97 | HR 78 | Ht 69.0 in | Wt 284.0 lb

## 2020-09-05 DIAGNOSIS — N946 Dysmenorrhea, unspecified: Secondary | ICD-10-CM

## 2020-09-05 DIAGNOSIS — Z3202 Encounter for pregnancy test, result negative: Secondary | ICD-10-CM

## 2020-09-05 DIAGNOSIS — N921 Excessive and frequent menstruation with irregular cycle: Secondary | ICD-10-CM | POA: Diagnosis not present

## 2020-09-05 DIAGNOSIS — L680 Hirsutism: Secondary | ICD-10-CM

## 2020-09-05 DIAGNOSIS — Z124 Encounter for screening for malignant neoplasm of cervix: Secondary | ICD-10-CM

## 2020-09-05 DIAGNOSIS — D649 Anemia, unspecified: Secondary | ICD-10-CM | POA: Diagnosis not present

## 2020-09-05 LAB — POCT HEMOGLOBIN: Hemoglobin: 8 g/dL — AB (ref 11–14.6)

## 2020-09-05 LAB — POCT URINE PREGNANCY: Preg Test, Ur: NEGATIVE

## 2020-09-05 NOTE — Progress Notes (Signed)
Patient ID: Sherri Campbell, female   DOB: 17-Mar-1981, 40 y.o.   MRN: [id number] History of Present Illness:  Sherri Campbell is a 40 year old black female,single, [id number], in for irregular and heavy periods.She says periods have also been irregular since starting at age 40.She may skip 2 months at a time. Periods last 4-5 days and heavy 2-3 days changes big pads every hour, has some clots and cramps. Last pap 2017, normal with negative HPV. Dr Karilyn Cota started her on Prometrium in February and told her she may have PCO. She has had a tubal, and boyfriend wants a baby. PCP is Dr Karilyn Cota.  Current Medications, Allergies, Past Medical History, Past Surgical History, Family History and Social History were reviewed in [employer] record.     Review of Systems: Patient denies any headaches, hearing loss, fatigue, blurred vision, shortness of breath(at times has asthma), chest pain, abdominal pain, problems with bowel movements, urination, or intercourse. No joint pain or mood swings. See HPI for positives   Physical Exam:BP (!) 142/97 (BP Location: Left Arm, Patient Position: Sitting, Cuff Size: Large)   Pulse 78   Ht 5\' 9"  (1.753 m)   Wt 284 lb (128.8 kg)   BMI 41.94 kg/m  UPT is negative, POC HGB 8. General:  Well developed, well nourished, no acute distress Skin:  Warm and dry,hair on chin, she shaves Neck:  Midline trachea, normal thyroid, good ROM, no lymphadenopathy Lungs; Clear to auscultation bilaterally Cardiovascular: Regular rate and rhythm Pelvic:  External genitalia is normal in appearance, no lesions.  The vagina is normal in appearance. Urethra has no lesions or masses. The cervix is bulbous.Pap with GC/CHL and HR HPV genotyping performed.  Uterus is felt to be normal size, shape, and contour.  No adnexal masses or tenderness noted.Bladder is non tender, no masses felt. Extremities/musculoskeletal:  No swelling or varicosities noted, no clubbing or cyanosis Psych:  No  mood changes, alert and cooperative,seems happy AA is 1 Fall is is low PHQ 9 score is 11, no SI GAD 7 score is 6  [facility] - 09/05/20 1007      Pregnancy Intention Screening   Does the patient want to become pregnant in the next year? Unsure    Does the patient's partner want to become pregnant in the next year? Unsure    Would the patient like to discuss contraceptive options today? No      Contraception Wrap Up   Current Method Female Sterilization    End Method Female Sterilization    Contraception Counseling Provided No         Examination chaperoned by 09/07/20 LPN.  Impression and Plan: 1. Pregnancy examination or test, negative result - POCT urine pregnancy  2. Anemia, unspecified type - POCT hemoglobin - Iron, TIBC and Ferritin Panel - CBC Take iron 1 bid   3. Routine cervical smear Pap sent with GC/CHL and HR HPV genotyping  - Cytology - PAP( [facility])  4. Menorrhagia with irregular cycle - Malachy Mood PELVIC COMPLETE WITH TRANSVAGINAL; Future, scheduled at [facility] 4/5 at 2:30 pm  - Iron, TIBC and Ferritin Panel - CBC Return to see me 09/13/20 at 1 pm to review labs and 11/13/20 and treatment options   5. Hirsutism  6. Dysmenorrhea

## 2020-09-06 ENCOUNTER — Telehealth: Payer: Self-pay | Admitting: Adult Health

## 2020-09-06 LAB — IRON,TIBC AND FERRITIN PANEL
Ferritin: 6 ng/mL — ABNORMAL LOW (ref 15–150)
Iron Saturation: 4 % — CL (ref 15–55)
Iron: 19 ug/dL — ABNORMAL LOW (ref 27–159)
Total Iron Binding Capacity: 444 ug/dL (ref 250–450)
UIBC: 425 ug/dL (ref 131–425)

## 2020-09-06 LAB — CBC
Hematocrit: 28.8 % — ABNORMAL LOW (ref 34.0–46.6)
Hemoglobin: 7.7 g/dL — ABNORMAL LOW (ref 11.1–15.9)
MCH: 16.7 pg — ABNORMAL LOW (ref 26.6–33.0)
MCHC: 26.7 g/dL — ABNORMAL LOW (ref 31.5–35.7)
MCV: 63 fL — ABNORMAL LOW (ref 79–97)
Platelets: 488 10*3/uL — ABNORMAL HIGH (ref 150–450)
RBC: 4.6 x10E6/uL (ref 3.77–5.28)
RDW: 23.1 % — ABNORMAL HIGH (ref 11.7–15.4)
WBC: 5.6 10*3/uL (ref 3.4–10.8)

## 2020-09-06 NOTE — Telephone Encounter (Signed)
Call me Monday about labs

## 2020-09-10 ENCOUNTER — Other Ambulatory Visit: Payer: Self-pay

## 2020-09-10 ENCOUNTER — Ambulatory Visit (HOSPITAL_COMMUNITY)
Admission: RE | Admit: 2020-09-10 | Discharge: 2020-09-10 | Disposition: A | Discharge status: Home / Self Care | Payer: Medicaid Other | Source: Ambulatory Visit | Attending: Adult Health | Admitting: Adult Health

## 2020-09-10 DIAGNOSIS — N921 Excessive and frequent menstruation with irregular cycle: Secondary | ICD-10-CM | POA: Diagnosis present

## 2020-09-10 LAB — CYTOLOGY - PAP
Chlamydia: NEGATIVE
Comment: NEGATIVE
Comment: NEGATIVE
Comment: NEGATIVE
Comment: NORMAL
Diagnosis: NEGATIVE
HPV 16: NEGATIVE
HPV 18 / 45: NEGATIVE
High risk HPV: POSITIVE — AB
Neisseria Gonorrhea: NEGATIVE

## 2020-09-13 ENCOUNTER — Telehealth: Payer: Self-pay | Admitting: Adult Health

## 2020-09-13 ENCOUNTER — Ambulatory Visit: Payer: Medicaid Other | Admitting: Adult Health

## 2020-09-13 NOTE — Telephone Encounter (Signed)
Left message that we do need to talk, about labs and [place], but you missed appointment today, call me back

## 2020-09-13 NOTE — Telephone Encounter (Signed)
Patient wants a return call to discuss test results. Clinical staff will follow up with patient.

## 2020-09-16 ENCOUNTER — Telehealth: Payer: Self-pay | Admitting: Adult Health

## 2020-09-16 NOTE — Telephone Encounter (Signed)
Left message to call me.

## 2020-09-19 ENCOUNTER — Telehealth: Payer: Self-pay | Admitting: Adult Health

## 2020-09-19 ENCOUNTER — Encounter: Payer: Self-pay | Admitting: Adult Health

## 2020-09-19 DIAGNOSIS — R87810 Cervical high risk human papillomavirus (HPV) DNA test positive: Secondary | ICD-10-CM | POA: Insufficient documentation

## 2020-09-19 HISTORY — DX: Cervical high risk human papillomavirus (HPV) DNA test positive: R87.810

## 2020-09-19 NOTE — Telephone Encounter (Signed)
Left message to call me about pap and about getting iron infusion.

## 2020-09-23 ENCOUNTER — Telehealth: Payer: Self-pay | Admitting: Adult Health

## 2020-09-23 ENCOUNTER — Encounter: Payer: Self-pay | Admitting: Adult Health

## 2020-09-23 NOTE — Telephone Encounter (Signed)
Unable to reach pt by phone to reschedule missed appointment per Jennifer's request Mailed letter asking pt to call the office

## 2020-09-24 ENCOUNTER — Ambulatory Visit (INDEPENDENT_AMBULATORY_CARE_PROVIDER_SITE_OTHER): Payer: Medicaid Other | Admitting: Internal Medicine

## 2020-10-14 ENCOUNTER — Ambulatory Visit: Payer: Medicaid Other | Admitting: Adult Health

## 2020-10-22 ENCOUNTER — Other Ambulatory Visit (INDEPENDENT_AMBULATORY_CARE_PROVIDER_SITE_OTHER): Payer: Self-pay | Admitting: Internal Medicine

## 2020-10-22 ENCOUNTER — Ambulatory Visit (INDEPENDENT_AMBULATORY_CARE_PROVIDER_SITE_OTHER): Payer: Medicaid Other | Admitting: Internal Medicine

## 2020-10-24 ENCOUNTER — Telehealth: Payer: Self-pay | Admitting: Adult Health

## 2020-10-24 NOTE — Telephone Encounter (Signed)
Tried to call patient to get her reschedule for her Follow up, but patient's voicemail is full and I was not able to leave a message. ~DR

## 2020-10-28 ENCOUNTER — Telehealth: Payer: Self-pay | Admitting: Adult Health

## 2020-10-28 NOTE — Telephone Encounter (Signed)
No VM, sent SMS notification to call [phone]

## 2020-11-12 ENCOUNTER — Encounter: Payer: Self-pay | Admitting: Adult Health

## 2020-11-27 ENCOUNTER — Other Ambulatory Visit (INDEPENDENT_AMBULATORY_CARE_PROVIDER_SITE_OTHER): Payer: Self-pay | Admitting: Internal Medicine

## 2020-11-27 ENCOUNTER — Ambulatory Visit (INDEPENDENT_AMBULATORY_CARE_PROVIDER_SITE_OTHER): Payer: Medicaid Other | Admitting: Internal Medicine

## 2021-01-24 ENCOUNTER — Emergency Department (HOSPITAL_COMMUNITY)
Admission: EM | Admit: 2021-01-24 | Discharge: 2021-01-25 | Disposition: A | Discharge status: Home / Self Care | Payer: Medicaid Other | Attending: Emergency Medicine | Admitting: Emergency Medicine

## 2021-01-24 ENCOUNTER — Other Ambulatory Visit: Payer: Self-pay

## 2021-01-24 ENCOUNTER — Encounter (HOSPITAL_COMMUNITY): Payer: Self-pay | Admitting: Emergency Medicine

## 2021-01-24 DIAGNOSIS — I10 Essential (primary) hypertension: Secondary | ICD-10-CM | POA: Insufficient documentation

## 2021-01-24 DIAGNOSIS — R112 Nausea with vomiting, unspecified: Secondary | ICD-10-CM | POA: Insufficient documentation

## 2021-01-24 DIAGNOSIS — J45909 Unspecified asthma, uncomplicated: Secondary | ICD-10-CM | POA: Diagnosis not present

## 2021-01-24 DIAGNOSIS — R4182 Altered mental status, unspecified: Secondary | ICD-10-CM | POA: Insufficient documentation

## 2021-01-24 LAB — CBC WITH DIFFERENTIAL/PLATELET
Abs Immature Granulocytes: 0.02 10*3/uL (ref 0.00–0.07)
Basophils Absolute: 0 10*3/uL (ref 0.0–0.1)
Basophils Relative: 0 %
Eosinophils Absolute: 0 10*3/uL (ref 0.0–0.5)
Eosinophils Relative: 1 %
HCT: 27.9 % — ABNORMAL LOW (ref 36.0–46.0)
Hemoglobin: 7.8 g/dL — ABNORMAL LOW (ref 12.0–15.0)
Immature Granulocytes: 0 %
Lymphocytes Relative: 26 %
Lymphs Abs: 1.8 10*3/uL (ref 0.7–4.0)
MCH: 19.2 pg — ABNORMAL LOW (ref 26.0–34.0)
MCHC: 28 g/dL — ABNORMAL LOW (ref 30.0–36.0)
MCV: 68.7 fL — ABNORMAL LOW (ref 80.0–100.0)
Monocytes Absolute: 0.5 10*3/uL (ref 0.1–1.0)
Monocytes Relative: 7 %
Neutro Abs: 4.4 10*3/uL (ref 1.7–7.7)
Neutrophils Relative %: 66 %
Platelets: 395 10*3/uL (ref 150–400)
RBC: 4.06 MIL/uL (ref 3.87–5.11)
RDW: 22.1 % — ABNORMAL HIGH (ref 11.5–15.5)
WBC: 6.7 10*3/uL (ref 4.0–10.5)
nRBC: 0 % (ref 0.0–0.2)

## 2021-01-24 LAB — COMPREHENSIVE METABOLIC PANEL
ALT: 43 U/L (ref 0–44)
AST: 30 U/L (ref 15–41)
Albumin: 3.5 g/dL (ref 3.5–5.0)
Alkaline Phosphatase: 131 U/L — ABNORMAL HIGH (ref 38–126)
Anion gap: 6 (ref 5–15)
BUN: 15 mg/dL (ref 6–20)
CO2: 26 mmol/L (ref 22–32)
Calcium: 7.5 mg/dL — ABNORMAL LOW (ref 8.9–10.3)
Chloride: 103 mmol/L (ref 98–111)
Creatinine, Ser: 0.62 mg/dL (ref 0.44–1.00)
GFR, Estimated: >60 mL/min (ref >60)
Glucose, Bld: 104 mg/dL — ABNORMAL HIGH (ref 70–99)
Potassium: 3.5 mmol/L (ref 3.5–5.1)
Sodium: 135 mmol/L (ref 135–145)
Total Bilirubin: 0.5 mg/dL (ref 0.3–1.2)
Total Protein: 7.6 g/dL (ref 6.5–8.1)

## 2021-01-24 LAB — ETHANOL: Alcohol, Ethyl (B): <10 mg/dL (ref <10)

## 2021-01-24 LAB — LACTIC ACID, PLASMA: Lactic Acid, Venous: 1.3 mmol/L (ref 0.5–1.9)

## 2021-01-24 MED ORDER — SODIUM CHLORIDE 0.9 % IV BOLUS
500.0000 mL | Freq: Once | INTRAVENOUS | Status: AC
  Administered 2021-01-25: 500 mL via INTRAVENOUS

## 2021-01-24 NOTE — ED Triage Notes (Signed)
Pt brought in by EMS after family called d/t pt vomiting. Pt states she ate some edibles (THC and  CBD) that she purchased from a local gas station. Pt also states she had a cup of wine as well. Per EMS, pt was very "out of it" when they arrived and they had to use ammonia pearls just to get pt down steps. Pt did not have any episodes of vomiting with EMS.

## 2021-01-24 NOTE — ED Provider Notes (Signed)
Emergency Department Provider Note   I have reviewed the triage vital signs and the nursing notes.   HISTORY  Chief Complaint Emesis   HPI Sherri Campbell is a 40 y.o. female with past medical history reviewed below presents to the emergency department after ingesting CBD Gummies.  She developed nausea, vomiting, altered mental status after this.  She states that she is feeling some stress but denies feeling suicidal or homicidal.  She took a CBD gummy sold at a gas station and shortly after developed symptoms.  She is not having chest or abdominal pain.  No unilateral weakness or numbness.  EMS found her to be fairly drowsy and seeming slightly confused. Patient did drink some EtOH as well but denies other drugs.    Past Medical History:  Diagnosis Date   ADHD (attention deficit hyperactivity disorder)    Asthma    Cervical high risk HPV (human papillomavirus) test positive 09/19/2020   Depression    Hypertension    Seasonal allergies     Patient Active Problem List   Diagnosis Date Noted   Cervical high risk HPV (human papillomavirus) test positive 09/19/2020   Menorrhagia with irregular cycle 09/05/2020   Routine cervical smear 09/05/2020   Pregnancy examination or test, negative result 09/05/2020   Anxiety 12/06/2018   Anemia 01/18/2016   Prediabetes 01/16/2016   PCOS (polycystic ovarian syndrome) 02/18/2015   Irregular bleeding 02/18/2015   Well woman exam 02/18/2015   Chest pain, unspecified 10/19/2012   Essential hypertension 01/31/2010   HIRSUTISM 01/31/2010   Obesity 07/12/2008   ALLERGIC RHINITIS 11/10/2006   Asthma 08/05/2006    Past Surgical History:  Procedure Laterality Date   ovarian cyst removed     TONSILLECTOMY     TUBAL LIGATION      Allergies Penicillins  Family History  Problem Relation Age of Onset   Heart disease Mother    Hypertension Mother    Diabetes Father    Hypertension Father    Diabetes Brother     Social  History Social History   Tobacco Use   Smoking status: Never   Smokeless tobacco: Never  Vaping Use   Vaping Use: Never used  Substance Use Topics   Alcohol use: Yes    Comment: occ   Drug use: No    Review of Systems  Constitutional: No fever/chills Eyes: No visual changes. ENT: No sore throat. Cardiovascular: Denies chest pain. Respiratory: Denies shortness of breath. Gastrointestinal: No abdominal pain. Positive nausea and vomiting.  No diarrhea.  No constipation. Genitourinary: Negative for dysuria. Musculoskeletal: Negative for back pain. Skin: Negative for rash. Neurological: Negative for headaches, focal weakness or numbness.  10-point ROS otherwise negative.  ____________________________________________   PHYSICAL EXAM:  VITAL SIGNS: ED Triage Vitals  Enc Vitals Group     BP --      Pulse Rate 01/24/21 2159 90     Resp 01/24/21 2159 (!) 21     Temp 01/24/21 2159 98.1 F (36.7 C)     Temp Source 01/24/21 2159 Oral     SpO2 01/24/21 2159 95 %     Weight 01/24/21 2156 270 lb (122.5 kg)     Height 01/24/21 2156 5\' 9"  (1.753 m)    Constitutional: Alert and oriented x 4. Patient with slight wandering gaze as if still under the influence but provides appropriate answers to orientation questions and provides a full history.  Eyes: Conjunctivae are normal. PERRL (3 mm). EOMI. Head: Atraumatic. Nose: No  congestion/rhinnorhea. Mouth/Throat: Mucous membranes are slightly dry.  Neck: No stridor.   Cardiovascular: Normal rate, regular rhythm. Good peripheral circulation. Grossly normal heart sounds.   Respiratory: Normal respiratory effort.  No retractions. Lungs CTAB. Gastrointestinal: Soft and nontender. No distention.  Musculoskeletal: No lower extremity tenderness nor edema. No gross deformities of extremities. Neurologic:  Normal speech and language. No gross focal neurologic deficits are appreciated.  Skin:  Skin is warm, dry and intact. No rash  noted.   ____________________________________________   LABS (all labs ordered are listed, but only abnormal results are displayed)  Labs Reviewed  COMPREHENSIVE METABOLIC PANEL - Abnormal; Notable for the following components:      Result Value   Glucose, Bld 104 (*)    Calcium 7.5 (*)    Alkaline Phosphatase 131 (*)    All other components within normal limits  CBC WITH DIFFERENTIAL/PLATELET - Abnormal; Notable for the following components:   Hemoglobin 7.8 (*)    HCT 27.9 (*)    MCV 68.7 (*)    MCH 19.2 (*)    MCHC 28.0 (*)    RDW 22.1 (*)    All other components within normal limits  RAPID URINE DRUG SCREEN, HOSP PERFORMED - Abnormal; Notable for the following components:   Tetrahydrocannabinol POSITIVE (*)    All other components within normal limits  ETHANOL  LACTIC ACID, PLASMA   ____________________________________________  EKG   EKG Interpretation  Date/Time:  Friday January 24 2021 22:01:24 EDT Ventricular Rate:  88 PR Interval:  177 QRS Duration: 104 QT Interval:  413 QTC Calculation: 500 R Axis:   -4 Text Interpretation: Sinus rhythm Abnormal R-wave progression, early transition Left ventricular hypertrophy Borderline T abnormalities, inferior leads Confirmed by Alona Bene [phone]) on 01/25/2021 12:12:22 AM        ____________________________________________  RADIOLOGY  No results found.  ____________________________________________   PROCEDURES  Procedure(s) performed:   Procedures   ____________________________________________   INITIAL IMPRESSION / ASSESSMENT AND PLAN / ED COURSE  Pertinent labs & imaging results that were available during my care of the patient were reviewed by me and considered in my medical decision making (see chart for details).   Patient presents to the emergency department with altered mental status after CBD Gummies.  Some vomiting.  Patient still appears to be somewhat under the influence but is able to  provide appropriate answers to questions and provide a history.  No focal neurologic deficits on exam.  No outward signs of head trauma.  Her history of ingesting CBD correlates with her presentation.  Do not plan on emergent neuroimaging.  Do plan for labs and reassess.  12:10 AM  Labs are largely reassuring. Baseline anemia not significantly changed from prior. No leukocytosis or lactate elevation to suspect infectious etiology for symptoms. Will allow to clinically sober and reassess.   03:50 AM  Patient up and ambulatory. Reports continuing to feel groggy from the CBD. She will call for a ride home. Can d/c once she gets a sober ride home.  ____________________________________________  FINAL CLINICAL IMPRESSION(S) / ED DIAGNOSES  Final diagnoses:  Non-intractable vomiting with nausea, unspecified vomiting type     MEDICATIONS GIVEN DURING THIS VISIT:  Medications  sodium chloride 0.9 % bolus 500 mL (0 mLs Intravenous Stopped 01/25/21 0038)     NEW OUTPATIENT MEDICATIONS STARTED DURING THIS VISIT:  Discharge Medication List as of 01/25/2021  6:40 AM     START taking these medications   Details  ondansetron (ZOFRAN ODT) 4  MG disintegrating tablet Take 1 tablet (4 mg total) by mouth every 8 (eight) hours as needed for nausea or vomiting., Starting Sat 01/25/2021, Normal        Note:  This document was prepared using Dragon voice recognition software and may include unintentional dictation errors.  Alona Bene, MD, [facility] Emergency Medicine    Shayda Kalka, Arlyss Repress, MD 02/01/21 [phone]

## 2021-01-25 LAB — RAPID URINE DRUG SCREEN, HOSP PERFORMED
Amphetamines: NOT DETECTED
Barbiturates: NOT DETECTED
Benzodiazepines: NOT DETECTED
Cocaine: NOT DETECTED
Opiates: NOT DETECTED
Tetrahydrocannabinol: POSITIVE — AB

## 2021-01-25 MED ORDER — ONDANSETRON 4 MG PO TBDP: 4.0000 mg | ORAL_TABLET | Freq: Three times a day (TID) | ORAL | 0 refills | Status: DC | PRN

## 2021-01-25 NOTE — ED Notes (Signed)
Pt ate without difficulty et ambulated around room well.

## 2021-01-25 NOTE — Discharge Instructions (Addendum)

## 2021-02-12 ENCOUNTER — Ambulatory Visit: Payer: Medicaid Other | Admitting: Internal Medicine

## 2021-04-14 ENCOUNTER — Encounter: Payer: Self-pay | Admitting: Internal Medicine

## 2021-04-14 ENCOUNTER — Ambulatory Visit (INDEPENDENT_AMBULATORY_CARE_PROVIDER_SITE_OTHER): Payer: Medicaid Other | Admitting: Internal Medicine

## 2021-04-14 ENCOUNTER — Other Ambulatory Visit: Payer: Self-pay

## 2021-04-14 VITALS — BP 162/92 | HR 90 | Resp 18 | Ht 70.0 in | Wt 287.0 lb

## 2021-04-14 DIAGNOSIS — I10 Essential (primary) hypertension: Secondary | ICD-10-CM | POA: Diagnosis not present

## 2021-04-14 DIAGNOSIS — R87810 Cervical high risk human papillomavirus (HPV) DNA test positive: Secondary | ICD-10-CM

## 2021-04-14 DIAGNOSIS — R7303 Prediabetes: Secondary | ICD-10-CM | POA: Diagnosis not present

## 2021-04-14 DIAGNOSIS — J4530 Mild persistent asthma, uncomplicated: Secondary | ICD-10-CM

## 2021-04-14 DIAGNOSIS — F418 Other specified anxiety disorders: Secondary | ICD-10-CM | POA: Diagnosis not present

## 2021-04-14 DIAGNOSIS — D500 Iron deficiency anemia secondary to blood loss (chronic): Secondary | ICD-10-CM

## 2021-04-14 DIAGNOSIS — L309 Dermatitis, unspecified: Secondary | ICD-10-CM

## 2021-04-14 DIAGNOSIS — E782 Mixed hyperlipidemia: Secondary | ICD-10-CM

## 2021-04-14 MED ORDER — IRON (FERROUS SULFATE) 325 (65 FE) MG PO TABS: 1.0000 | ORAL_TABLET | Freq: Two times a day (BID) | ORAL | 3 refills | Status: DC

## 2021-04-14 MED ORDER — OLMESARTAN MEDOXOMIL-HCTZ 20-12.5 MG PO TABS: 1.0000 | ORAL_TABLET | Freq: Every day | ORAL | 1 refills | Status: DC

## 2021-04-14 MED ORDER — BUDESONIDE-FORMOTEROL FUMARATE 80-4.5 MCG/ACT IN AERO: 2.0000 | INHALATION_SPRAY | Freq: Two times a day (BID) | RESPIRATORY_TRACT | 3 refills | Status: DC

## 2021-04-14 MED ORDER — TRIAMCINOLONE ACETONIDE 0.1 % EX CREA: 1.0000 "application " | TOPICAL_CREAM | Freq: Two times a day (BID) | CUTANEOUS | 0 refills | Status: DC

## 2021-04-14 MED ORDER — ALBUTEROL SULFATE HFA 108 (90 BASE) MCG/ACT IN AERS: 2.0000 | INHALATION_SPRAY | RESPIRATORY_TRACT | 3 refills | Status: DC | PRN

## 2021-04-14 MED ORDER — FLUOXETINE HCL 20 MG PO CAPS: 20.0000 mg | ORAL_CAPSULE | Freq: Every day | ORAL | 3 refills | Status: DC

## 2021-04-14 NOTE — Assessment & Plan Note (Signed)
Lab Results  Component Value Date   HGBA1C 5.4 07/25/2020   Needs to follow low carbohydrate diet 

## 2021-04-14 NOTE — Assessment & Plan Note (Signed)
HPV positive on Pap smear Needs to follow-up with OB/GYN

## 2021-04-14 NOTE — Assessment & Plan Note (Signed)
On Prozac, refilled

## 2021-04-14 NOTE — Patient Instructions (Addendum)
Please start taking Olmesartan-HCTZ for BP.  Please apply Kenalog cream for eczema.  Please start using Symbicort daily for asthma. Use Albuterol only for shortness of breath or wheezing.  Please contact your Ob/Gyn for discussing PAP smear and US abdomen as well as irregular, heavy bleeding issues.  Please get fasting blood tests done before the next visit.

## 2021-04-14 NOTE — Assessment & Plan Note (Addendum)
BP Readings from Last 1 Encounters:  04/14/21 (!) 162/92   uncontrolled with HCTZ Started olmesartan HCTZ instead Counseled for compliance with the medications Advised DASH diet and moderate exercise/walking, at least 150 mins/week

## 2021-04-14 NOTE — Assessment & Plan Note (Signed)
Refilled albuterol as needed Started Symbicort as she has been using albuterol too frequently for dyspnea/wheezing If persistent, will refer to pulmonology

## 2021-04-14 NOTE — Progress Notes (Signed)
New Patient Office Visit  Subjective:  Patient ID: Sherri Campbell, female    DOB: 1980-12-26  Age: 40 y.o. MRN: [id number]  CC:  Chief Complaint  Patient presents with   New Patient (Initial Visit)    New patient was seeing gosrani pt needs medication refills also has been having bad headaches for 1 month and her skin has been itching for awhile     HPI Sherri Campbell is a 40 y.o. female with past medical history of HTN, asthma, menorrhagia with irregular bleeding, hirsutism, IDA and morbid obesity who presents for establishing care.  HTN: Her BP was elevated in the office today. Of note, she has run out of her HCTZ.  She complains of generalized headache, which is chronic and intermittent.  Denies any dizziness, vision changes, chest pain or palpitations.  Asthma: She uses albuterol inhaler for dyspnea or wheezing, almost on a daily basis.  Denies any fever, chills or sore throat currently.  Menorrhagia with irregular bleeding: She has seen OB/GYN-Jennifer Laurann [place], NP once and had a Pap smear and US abdomen with pelvis.  She did not follow-up to discuss the Pap smear and US abdomen results.  She continues to have irregular bleeding with pain.  She also has history of IDA, for which she takes iron tablets.  She reports chronic fatigue and dyspnea with mild exertion.  She complains of itching over her extremities.  She has history of eczema.  She does not have any cream or ointment currently.  She has a history of anxiety with depression, for which she takes Prozac 20 mg daily.  She has had 3 doses of COVID-vaccine.  She refused flu vaccine in the office today.  Past Medical History:  Diagnosis Date   ADHD (attention deficit hyperactivity disorder)    Asthma    Cervical high risk HPV (human papillomavirus) test positive 09/19/2020   Depression    Hypertension    Seasonal allergies     Past Surgical History:  Procedure Laterality Date   ovarian cyst removed     TONSILLECTOMY      TUBAL LIGATION      Family History  Problem Relation Age of Onset   Heart disease Mother    Hypertension Mother    Diabetes Father    Hypertension Father    Diabetes Brother     Social History   Socioeconomic History   Marital status: Single    Spouse name: Not on file   Number of children: Not on file   Years of education: Not on file   Highest education level: Not on file  Occupational History   Not on file  Tobacco Use   Smoking status: Never   Smokeless tobacco: Never  Vaping Use   Vaping Use: Never used  Substance and Sexual Activity   Alcohol use: Yes    Comment: occ   Drug use: No   Sexual activity: Yes    Birth control/protection: Surgical    Comment: tubal  Other Topics Concern   Not on file  Social History Narrative   Engaged,lives with fiancee and one daughter.Unemployed.   Social Determinants of Health   Financial Resource Strain: Medium Risk   Difficulty of Paying Living Expenses: Somewhat hard  Food Insecurity: Food Insecurity Present   Worried About [place] in the Last Year: Sometimes true   Ran Out of Food in the Last Year: Sometimes true  Transportation Needs: No Transportation Needs   Lack of Transportation (  Medical): No   Lack of Transportation (Non-Medical): No  Physical Activity: Insufficiently Active   Days of Exercise per Week: 3 days   Minutes of Exercise per Session: 30 min  Stress: No Stress Concern Present   Feeling of Stress : Only a little  Social Connections: Moderately Integrated   Frequency of Communication with Friends and Family: Once a week   Frequency of Social Gatherings with Friends and Family: Three times a week   Attends Religious Services: 1 to 4 times per year   Active Member of Clubs or Organizations: No   Attends Archivist Meetings: Never   Marital Status: Living with partner  Intimate Partner Violence: Not At Risk   Fear of Current or Ex-Partner: No   Emotionally Abused: No    Physically Abused: No   Sexually Abused: No    ROS Review of Systems  Constitutional:  Negative for chills and fever.  HENT:  Negative for congestion, sinus pressure, sinus pain and sore throat.   Eyes:  Negative for pain and discharge.  Respiratory:  Positive for cough, shortness of breath and wheezing.   Cardiovascular:  Negative for chest pain and palpitations.  Gastrointestinal:  Negative for abdominal pain, diarrhea, nausea and vomiting.  Endocrine: Negative for polydipsia and polyuria.  Genitourinary:  Negative for dysuria and hematuria.  Musculoskeletal:  Negative for neck pain and neck stiffness.  Skin:  Positive for rash.  Neurological:  Positive for headaches. Negative for dizziness and weakness.  Psychiatric/Behavioral:  Positive for sleep disturbance. Negative for agitation and behavioral problems. The patient is nervous/anxious.    Objective:   Today's Vitals: BP (!) 162/92 (BP Location: Left Arm)   Pulse 90   Resp 18   Ht '5\' 10"'  (1.778 m)   Wt 287 lb 0.6 oz (130.2 kg)   SpO2 98%   BMI 41.19 kg/m   Physical Exam Vitals reviewed.  Constitutional:      General: She is not in acute distress.    Appearance: She is obese. She is not diaphoretic.  HENT:     Head: Normocephalic and atraumatic.     Nose: Nose normal.     Mouth/Throat:     Mouth: Mucous membranes are moist.  Eyes:     General: No scleral icterus.    Extraocular Movements: Extraocular movements intact.  Cardiovascular:     Rate and Rhythm: Normal rate and regular rhythm.     Pulses: Normal pulses.     Heart sounds: Normal heart sounds. No murmur heard. Pulmonary:     Breath sounds: Normal breath sounds. No wheezing or rales.  Musculoskeletal:     Cervical back: Neck supple. No tenderness.     Right lower leg: No edema.     Left lower leg: No edema.  Skin:    General: Skin is warm.     Findings: Rash (Erythematous plaques over b/l UE, some lichenified plaques) present.  Neurological:      General: No focal deficit present.     Mental Status: She is alert and oriented to person, place, and time.     Sensory: No sensory deficit.     Motor: No weakness.  Psychiatric:        Mood and Affect: Mood normal.        Behavior: Behavior normal.    Assessment & Plan:   Problem List Items Addressed This Visit       Cardiovascular and Mediastinum   Essential hypertension - Primary  BP Readings from Last 1 Encounters:  04/14/21 (!) 162/92  uncontrolled with HCTZ Started olmesartan HCTZ instead Counseled for compliance with the medications Advised DASH diet and moderate exercise/walking, at least 150 mins/week       Relevant Medications   olmesartan-hydrochlorothiazide (BENICAR HCT) 20-12.5 MG tablet   Other Relevant Orders   CBC with Differential/Platelet   CMP14+EGFR     Respiratory   Asthma    Refilled albuterol as needed Started Symbicort as she has been using albuterol too frequently for dyspnea/wheezing If persistent, will refer to pulmonology      Relevant Medications   albuterol (VENTOLIN HFA) 108 (90 Base) MCG/ACT inhaler   budesonide-formoterol (SYMBICORT) 80-4.5 MCG/ACT inhaler     Other   Morbid obesity (HCC)    Diet modification and moderate exercise/walking advised She walks to her job every day, does not have a car      Prediabetes    Lab Results  Component Value Date   HGBA1C 5.4 07/25/2020  Needs to follow low carbohydrate diet      Relevant Orders   HgB A1c   Iron deficiency anemia    History of menorrhagia with irregular bleeding Check CBC Continue iron supplements Needs to follow-up with OB/GYN for menorrhagia      Relevant Medications   Iron, Ferrous Sulfate, 325 (65 Fe) MG TABS   Depression with anxiety    On Prozac, refilled      Relevant Medications   FLUoxetine (PROZAC) 20 MG capsule   Cervical high risk HPV (human papillomavirus) test positive    HPV positive on Pap smear Needs to follow-up with OB/GYN       Other Visit Diagnoses     Eczema, unspecified type       Relevant Medications   triamcinolone cream (KENALOG) 0.1 %   Mixed hyperlipidemia       Relevant Medications   olmesartan-hydrochlorothiazide (BENICAR HCT) 20-12.5 MG tablet   Other Relevant Orders   Lipid panel       Outpatient Encounter Medications as of 04/14/2021  Medication Sig   budesonide-formoterol (SYMBICORT) 80-4.5 MCG/ACT inhaler Inhale 2 puffs into the lungs 2 (two) times daily.   olmesartan-hydrochlorothiazide (BENICAR HCT) 20-12.5 MG tablet Take 1 tablet by mouth daily.   triamcinolone cream (KENALOG) 0.1 % Apply 1 application topically 2 (two) times daily.   albuterol (VENTOLIN HFA) 108 (90 Base) MCG/ACT inhaler Inhale 2 puffs into the lungs every 4 (four) hours as needed for wheezing or shortness of breath.   FLUoxetine (PROZAC) 20 MG capsule Take 1 capsule (20 mg total) by mouth daily.   Iron, Ferrous Sulfate, 325 (65 Fe) MG TABS Take 1 tablet by mouth 2 (two) times daily.   ondansetron (ZOFRAN ODT) 4 MG disintegrating tablet Take 1 tablet (4 mg total) by mouth every 8 (eight) hours as needed for nausea or vomiting. (Patient not taking: Reported on 04/14/2021)   progesterone (PROMETRIUM) 200 MG capsule Take 1 capsule (200 mg total) by mouth daily. (Patient not taking: Reported on 04/14/2021)   [DISCONTINUED] albuterol (VENTOLIN HFA) 108 (90 Base) MCG/ACT inhaler Inhale 2 puffs into the lungs every 4 (four) hours as needed for wheezing or shortness of breath. (Patient not taking: Reported on 04/14/2021)   [DISCONTINUED] FLUoxetine (PROZAC) 20 MG capsule Take 1 capsule (20 mg total) by mouth daily. (Patient not taking: Reported on 04/14/2021)   [DISCONTINUED] hydrochlorothiazide (MICROZIDE) 12.5 MG capsule Take 1 capsule (12.5 mg total) by mouth daily. (Patient not taking: Reported on 04/14/2021)   [  DISCONTINUED] Iron, Ferrous Sulfate, 325 (65 Fe) MG TABS Take 1 tablet by mouth 2 (two) times daily. (Patient not taking:  Reported on 04/14/2021)   No facility-administered encounter medications on file as of 04/14/2021.    Follow-up: Return in about 3 weeks (around 05/05/2021) for HTN.   Lindell Spar, MD

## 2021-04-14 NOTE — Assessment & Plan Note (Signed)
History of menorrhagia with irregular bleeding Check CBC Continue iron supplements Needs to follow-up with OB/GYN for menorrhagia

## 2021-04-14 NOTE — Assessment & Plan Note (Signed)
Diet modification and moderate exercise/walking advised She walks to her job every day, does not have a car

## 2021-05-04 IMAGING — US US PELVIS COMPLETE WITH TRANSVAGINAL
1 series · 13 of 25 positions shown · non-contrast
Comparison: 07/01/2018

CLINICAL DATA: Menorrhagia; LMP 2 weeks ago



[Series 1: us pelvis complete with transvaginal · 0.24mm/px · 69 acquisitions, 13 frames shown]
[im 1/69]
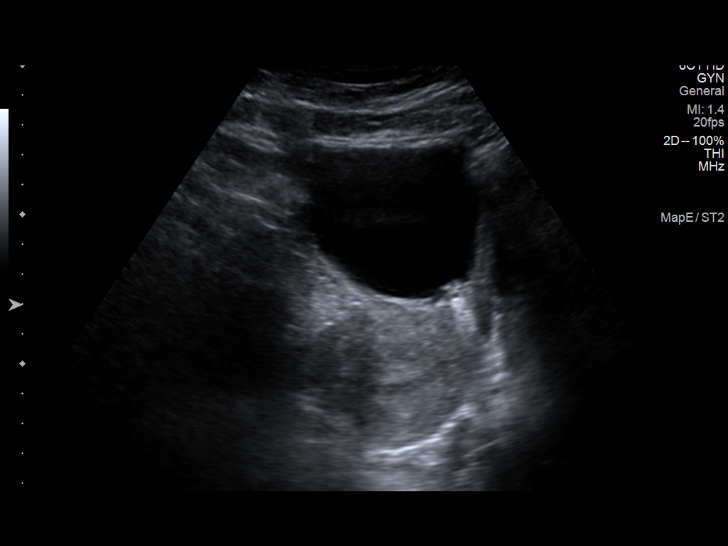
[im 6/69]
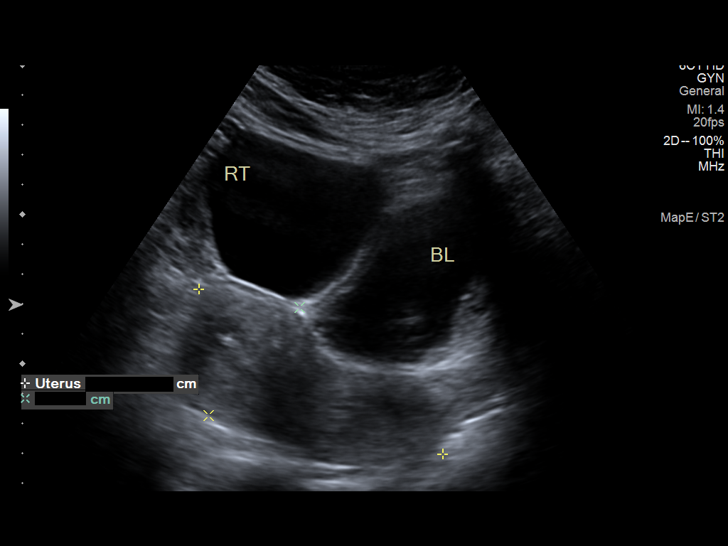
[im 12/69]
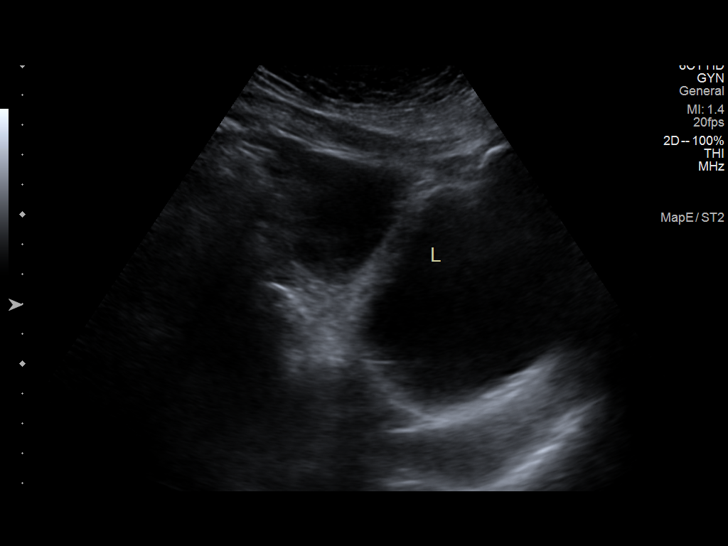
[im 18/69]
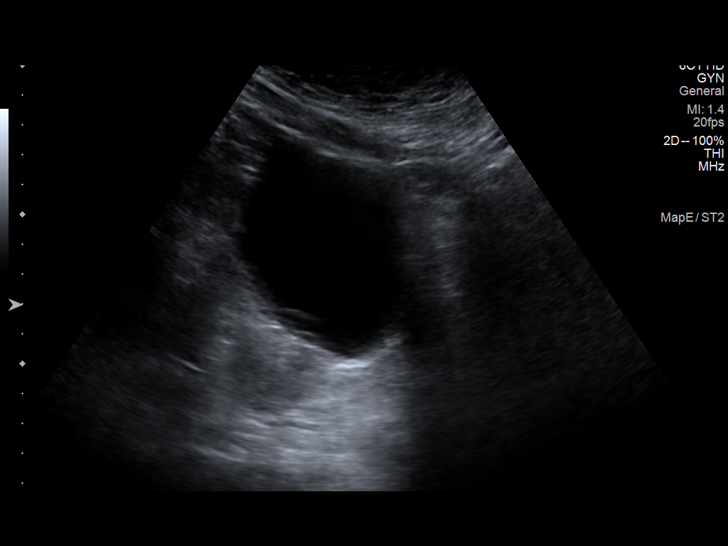
[im 23/69]
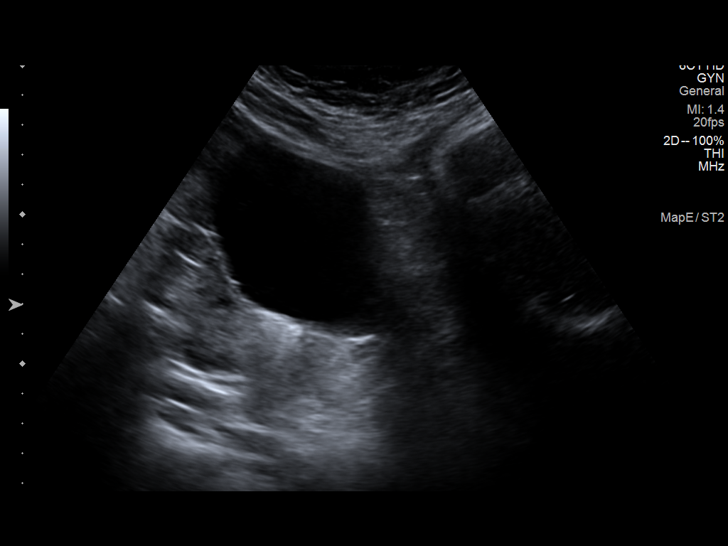
[im 29/69]
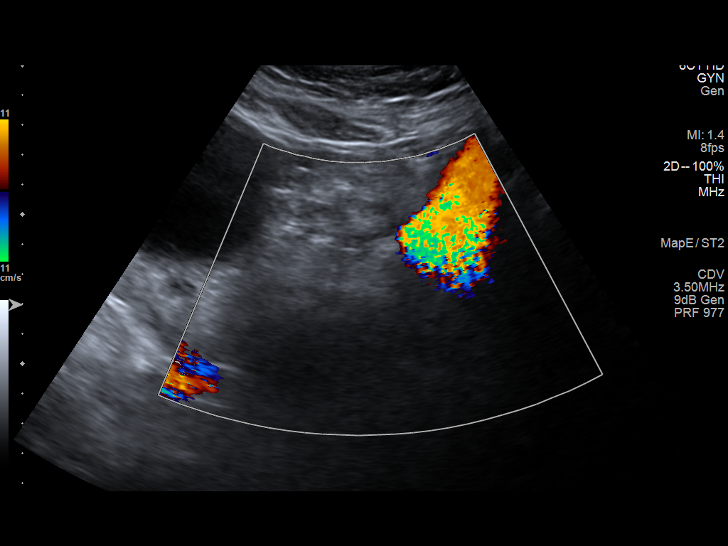
[im 35/69]
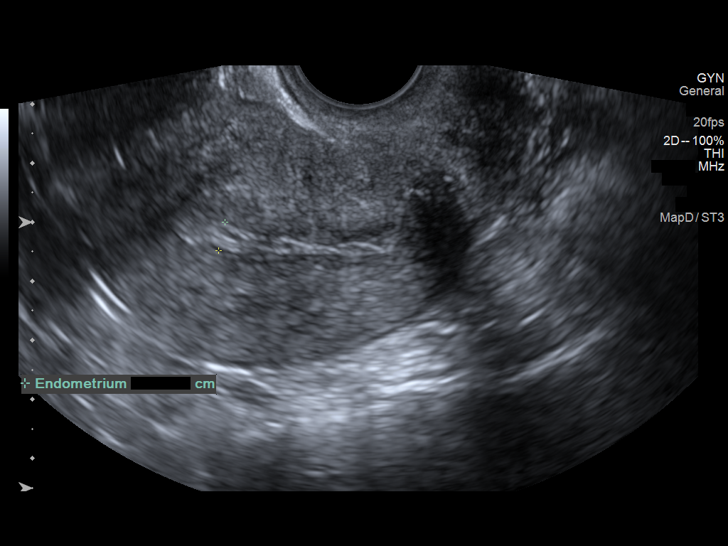
[im 40/69]
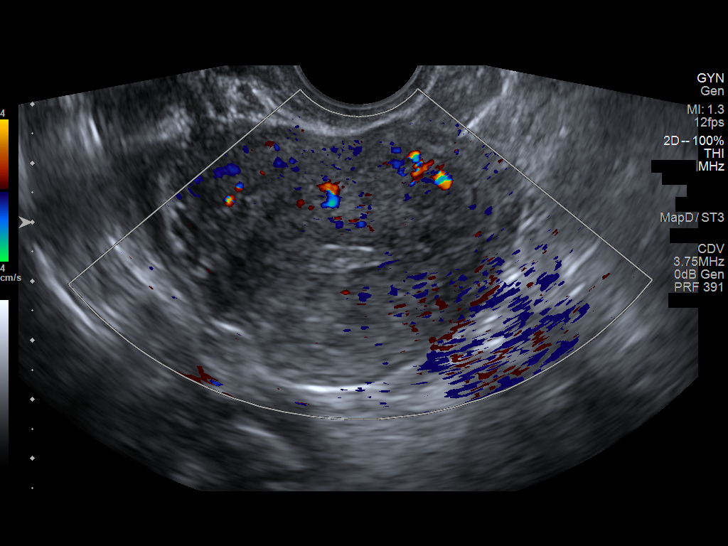
[im 46/69]
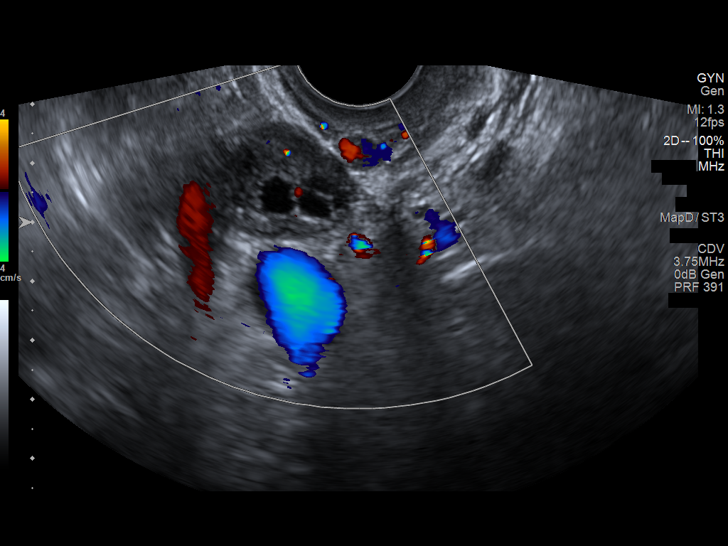
[im 52/69]
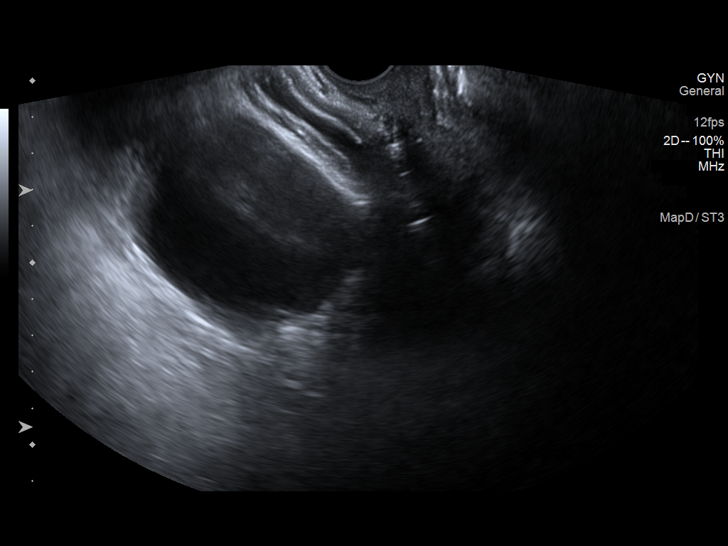
[im 57/69]
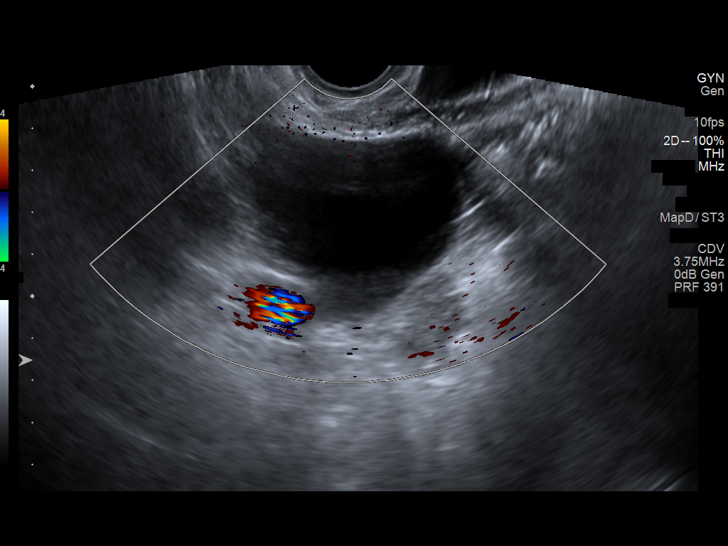
[im 63/69]
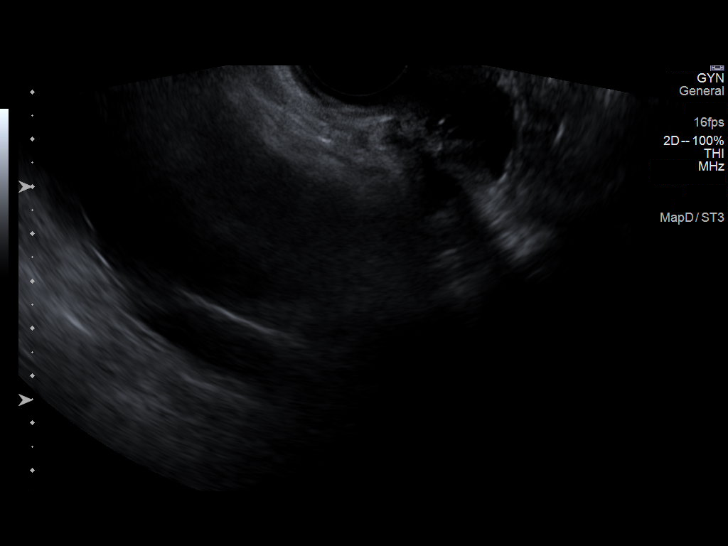
[im 69/69]
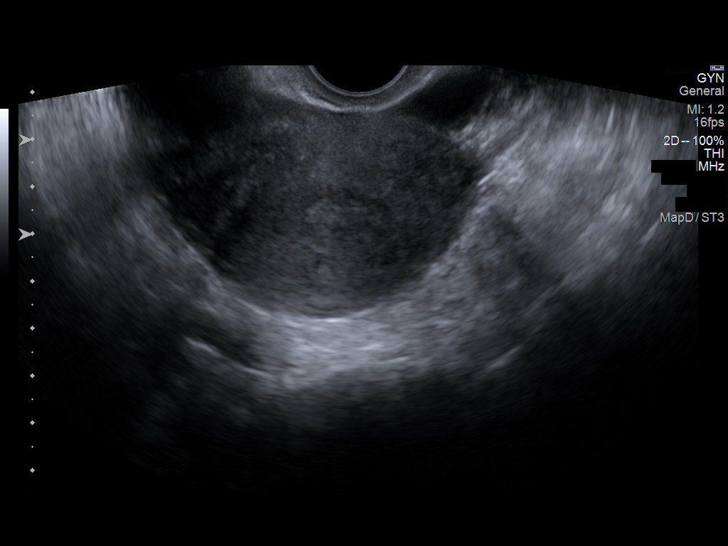

[13 of 25 positions shown; findings below may reference images not displayed]

FINDINGS: Uterus

Measurements: 8.6 x 4.9 x 5.9 cm = volume: 130 mL. Anteverted.
Nabothian cyst in cervix. No additional uterine mass.

Endometrium

Thickness: 5 mm.  No endometrial fluid or focal abnormality

Right ovary

No normal appearing ovary visualized, see below

Left ovary

Measurements: 3.0 x 2.1 x 2.4 cm = volume: 8.0 mL. Normal morphology
without mass

Other findings

No free pelvic fluid. Large abnormal cyst identified within RIGHT
adnexa, potentially of RIGHT ovarian or paraovarian origin. Lesion
measures 8.1 x 6.0 x 6.4 cm (previously 7.3 x 7.6 x 8.4 cm) and
demonstrates simple features; Recommend follow-up US in 3-6 months.
Note: This recommendation does not apply to premenarchal patients or
to those with increased risk (genetic, family history, elevated
tumor markers or other high-risk factors) of ovarian cancer.
Reference: Radiology [DATE]):[id number].
IMPRESSION: Unremarkable uterus, endometrial complex and LEFT ovary.

Large simple cyst identified in RIGHT adnexa either of ovarian or
paraovarian origin, 8.1 cm greatest size, little changed from the
8.4 cm greatest size on the prior exam; this is either a
non-neoplastic cyst or a benign cystic neoplasm and either followup
ultrasound or MR is recommended in June 2021 to establish
continued stability.

## 2021-05-05 ENCOUNTER — Other Ambulatory Visit: Payer: Self-pay

## 2021-05-05 ENCOUNTER — Encounter: Payer: Self-pay | Admitting: Internal Medicine

## 2021-05-05 ENCOUNTER — Ambulatory Visit (INDEPENDENT_AMBULATORY_CARE_PROVIDER_SITE_OTHER): Payer: Medicaid Other | Admitting: Internal Medicine

## 2021-05-05 ENCOUNTER — Encounter (INDEPENDENT_AMBULATORY_CARE_PROVIDER_SITE_OTHER): Payer: Self-pay

## 2021-05-05 DIAGNOSIS — J069 Acute upper respiratory infection, unspecified: Secondary | ICD-10-CM | POA: Diagnosis not present

## 2021-05-05 MED ORDER — BENZONATATE 100 MG PO CAPS: 100.0000 mg | ORAL_CAPSULE | Freq: Two times a day (BID) | ORAL | 0 refills | Status: DC | PRN

## 2021-05-05 NOTE — Progress Notes (Signed)
Virtual Visit via Telephone Note   This visit type was conducted due to national recommendations for restrictions regarding the COVID-19 Pandemic (e.g. social distancing) in an effort to limit this patient's exposure and mitigate transmission in our community.  Due to her co-morbid illnesses, this patient is at least at moderate risk for complications without adequate follow up.  This format is felt to be most appropriate for this patient at this time.  The patient did not have access to video technology/had technical difficulties with video requiring transitioning to audio format only (telephone).  All issues noted in this document were discussed and addressed.  No physical exam could be performed with this format.  Evaluation Performed:  Follow-up visit  Date:  05/05/2021   ID:  Sherri Campbell, DOB July 08, 1980, MRN [id number]  Patient Location: Home Provider Location: Office/Clinic  Participants: Patient Location of Patient: Home Location of Provider: Telehealth Consent was obtain for visit to be over via telehealth. I verified that I am speaking with the correct person using two identifiers.  PCP:  Anabel Halon, MD   Chief Complaint: Cough, fatigue and headache  History of Present Illness:    Sherri Campbell is a 40 y.o. female who has a televisit for complaint of cough, fatigue and headache since yesterday.  She denies any fever, chills, dyspnea or wheezing currently.  Her daughter also has similar symptoms.  She has not had COVID test yet.  The patient does have symptoms concerning for COVID-19 infection (fever, chills, cough, or new shortness of breath).   Past Medical, Surgical, Social History, Allergies, and Medications have been Reviewed.  Past Medical History:  Diagnosis Date   ADHD (attention deficit hyperactivity disorder)    Asthma    Cervical high risk HPV (human papillomavirus) test positive 09/19/2020   Depression    Hypertension    Seasonal allergies     Past Surgical History:  Procedure Laterality Date   ovarian cyst removed     TONSILLECTOMY     TUBAL LIGATION       Current Meds  Medication Sig   albuterol (VENTOLIN HFA) 108 (90 Base) MCG/ACT inhaler Inhale 2 puffs into the lungs every 4 (four) hours as needed for wheezing or shortness of breath.   budesonide-formoterol (SYMBICORT) 80-4.5 MCG/ACT inhaler Inhale 2 puffs into the lungs 2 (two) times daily.   FLUoxetine (PROZAC) 20 MG capsule Take 1 capsule (20 mg total) by mouth daily.   Iron, Ferrous Sulfate, 325 (65 Fe) MG TABS Take 1 tablet by mouth 2 (two) times daily.   olmesartan-hydrochlorothiazide (BENICAR HCT) 20-12.5 MG tablet Take 1 tablet by mouth daily.   ondansetron (ZOFRAN ODT) 4 MG disintegrating tablet Take 1 tablet (4 mg total) by mouth every 8 (eight) hours as needed for nausea or vomiting.   progesterone (PROMETRIUM) 200 MG capsule Take 1 capsule (200 mg total) by mouth daily.   triamcinolone cream (KENALOG) 0.1 % Apply 1 application topically 2 (two) times daily.     Allergies:   Penicillins   ROS:   Please see the history of present illness.     All other systems reviewed and are negative.   Labs/Other Tests and Data Reviewed:    Recent Labs: 07/25/2020: TSH 3.07 01/24/2021: ALT 43; BUN 15; Creatinine, Ser 0.62; Hemoglobin 7.8; Platelets 395; Potassium 3.5; Sodium 135   Recent Lipid Panel Lab Results  Component Value Date/Time   CHOL 203 (H) 07/25/2020 12:00 AM   TRIG 137 07/25/2020 12:00  AM   HDL 48 (L) 07/25/2020 12:00 AM   CHOLHDL 4.2 07/25/2020 12:00 AM   LDLCALC 130 (H) 07/25/2020 12:00 AM    Wt Readings from Last 3 Encounters:  04/14/21 287 lb 0.6 oz (130.2 kg)  01/24/21 270 lb (122.5 kg)  09/05/20 284 lb (128.8 kg)    ASSESSMENT & PLAN:    URTI Check rapid flu and COVID test -advised to do home COVID test Tessalon Perles as needed for cough Mucinex or Robitussin as needed If persistent symptoms with negative flu and COVID test, will  start antibiotic  Time:   Today, I have spent 9 minutes reviewing the chart, including problem list, medications, and with the patient with telehealth technology discussing the above problems.   Medication Adjustments/Labs and Tests Ordered: Current medicines are reviewed at length with the patient today.  Concerns regarding medicines are outlined above.   Tests Ordered: No orders of the defined types were placed in this encounter.   Medication Changes: No orders of the defined types were placed in this encounter.    Note: This dictation was prepared with Dragon dictation along with smaller phrase technology. Similar sounding words can be transcribed inadequately or may not be corrected upon review. Any transcriptional errors that result from this process are unintentional.      Disposition:  Follow up  Signed, Anabel Halon, MD  05/05/2021 11:24 AM     [facility]

## 2021-05-14 ENCOUNTER — Ambulatory Visit: Payer: Medicaid Other | Admitting: Internal Medicine

## 2021-07-04 NOTE — Addendum Note (Signed)
Encounter addended by: Novella Olive on: 07/04/2021 3:12 PM  Actions taken: Letter saved

## 2021-07-24 ENCOUNTER — Other Ambulatory Visit: Payer: Self-pay | Admitting: Internal Medicine

## 2021-07-24 DIAGNOSIS — I10 Essential (primary) hypertension: Secondary | ICD-10-CM

## 2021-08-14 ENCOUNTER — Other Ambulatory Visit: Payer: Self-pay

## 2021-08-14 ENCOUNTER — Emergency Department (HOSPITAL_COMMUNITY)
Admission: EM | Admit: 2021-08-14 | Discharge: 2021-08-14 | Disposition: A | Discharge status: Home / Self Care | Payer: Medicaid Other | Attending: Emergency Medicine | Admitting: Emergency Medicine

## 2021-08-14 ENCOUNTER — Encounter (HOSPITAL_COMMUNITY): Payer: Self-pay

## 2021-08-14 ENCOUNTER — Emergency Department (HOSPITAL_COMMUNITY): Payer: Medicaid Other

## 2021-08-14 DIAGNOSIS — J45909 Unspecified asthma, uncomplicated: Secondary | ICD-10-CM | POA: Insufficient documentation

## 2021-08-14 DIAGNOSIS — N9489 Other specified conditions associated with female genital organs and menstrual cycle: Secondary | ICD-10-CM | POA: Insufficient documentation

## 2021-08-14 DIAGNOSIS — I10 Essential (primary) hypertension: Secondary | ICD-10-CM | POA: Insufficient documentation

## 2021-08-14 DIAGNOSIS — Z7951 Long term (current) use of inhaled steroids: Secondary | ICD-10-CM | POA: Insufficient documentation

## 2021-08-14 DIAGNOSIS — K5900 Constipation, unspecified: Secondary | ICD-10-CM | POA: Insufficient documentation

## 2021-08-14 DIAGNOSIS — Z79899 Other long term (current) drug therapy: Secondary | ICD-10-CM | POA: Diagnosis not present

## 2021-08-14 DIAGNOSIS — N939 Abnormal uterine and vaginal bleeding, unspecified: Secondary | ICD-10-CM

## 2021-08-14 DIAGNOSIS — N938 Other specified abnormal uterine and vaginal bleeding: Secondary | ICD-10-CM | POA: Diagnosis not present

## 2021-08-14 DIAGNOSIS — R1030 Lower abdominal pain, unspecified: Secondary | ICD-10-CM | POA: Diagnosis not present

## 2021-08-14 LAB — CBC WITH DIFFERENTIAL/PLATELET
Abs Immature Granulocytes: 0.01 10*3/uL (ref 0.00–0.07)
Basophils Absolute: 0 10*3/uL (ref 0.0–0.1)
Basophils Relative: 1 %
Eosinophils Absolute: 0.1 10*3/uL (ref 0.0–0.5)
Eosinophils Relative: 2 %
HCT: 33.1 % — ABNORMAL LOW (ref 36.0–46.0)
Hemoglobin: 9.6 g/dL — ABNORMAL LOW (ref 12.0–15.0)
Immature Granulocytes: 0 %
Lymphocytes Relative: 42 %
Lymphs Abs: 2 10*3/uL (ref 0.7–4.0)
MCH: 21.1 pg — ABNORMAL LOW (ref 26.0–34.0)
MCHC: 29 g/dL — ABNORMAL LOW (ref 30.0–36.0)
MCV: 72.9 fL — ABNORMAL LOW (ref 80.0–100.0)
Monocytes Absolute: 0.4 10*3/uL (ref 0.1–1.0)
Monocytes Relative: 8 %
Neutro Abs: 2.2 10*3/uL (ref 1.7–7.7)
Neutrophils Relative %: 47 %
Platelets: 378 10*3/uL (ref 150–400)
RBC: 4.54 MIL/uL (ref 3.87–5.11)
RDW: 19.9 % — ABNORMAL HIGH (ref 11.5–15.5)
WBC: 4.7 10*3/uL (ref 4.0–10.5)
nRBC: 0 % (ref 0.0–0.2)

## 2021-08-14 LAB — BASIC METABOLIC PANEL
Anion gap: 9 (ref 5–15)
BUN: 13 mg/dL (ref 6–20)
CO2: 23 mmol/L (ref 22–32)
Calcium: 7.8 mg/dL — ABNORMAL LOW (ref 8.9–10.3)
Chloride: 103 mmol/L (ref 98–111)
Creatinine, Ser: 0.6 mg/dL (ref 0.44–1.00)
GFR, Estimated: >60 mL/min (ref >60)
Glucose, Bld: 95 mg/dL (ref 70–99)
Potassium: 4 mmol/L (ref 3.5–5.1)
Sodium: 135 mmol/L (ref 135–145)

## 2021-08-14 LAB — I-STAT BETA HCG BLOOD, ED (MC, WL, AP ONLY): I-stat hCG, quantitative: <5 m[IU]/mL (ref <5)

## 2021-08-14 MED ORDER — MEDROXYPROGESTERONE ACETATE 10 MG PO TABS: 20.0000 mg | ORAL_TABLET | Freq: Three times a day (TID) | ORAL | 0 refills | Status: DC

## 2021-08-14 MED ORDER — MEDROXYPROGESTERONE ACETATE 150 MG/ML IM SUSP
150.0000 mg | Freq: Once | INTRAMUSCULAR | Status: DC
  Filled 2021-08-14: qty 1

## 2021-08-14 MED ORDER — IOHEXOL 300 MG/ML  SOLN
100.0000 mL | Freq: Once | INTRAMUSCULAR | Status: AC | PRN
  Administered 2021-08-14: 15:00:00 100 mL via INTRAVENOUS

## 2021-08-14 NOTE — Discharge Instructions (Addendum)
You were seen in the emergency department today for vaginal bleeding. ? ?As we discussed your lab work and imaging has all looked reassuring today.  Your hemoglobin levels are a little low, but not low enough to require blood today.  I'd like you to continue taking your iron supplementation. ? ?You can take ibuprofen or tylenol as needed for pain.  ? ?I'm prescribing you a medicine called Medroxyprogesterone to hopefully stop the bleeding. We gave you the first dose here and I'm sending you a prescription for the rest. You will take 20mg  by mouth 3 times per day for 3 days.  ? ?I've also sent you a referral to the gynecologist.  Also attached their contact information for you to call if you do not hear from them by the end of the week. ? ?Continue to monitor how you're doing and return to the ER for new or worsening symptoms.  ?
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
## 2021-08-14 NOTE — ED Provider Notes (Signed)
[facility] Provider Note   CSN: [id number] Arrival date & time: 08/14/21  1047     History  Chief Complaint  Patient presents with   Vaginal Bleeding    Sherri [facility] ClockL Hayashida is a 41 y.o. female with history of hypertension, asthma, PCOS, and iron deficiency anemia who presents emergency [facility] complaining of vaginal bleeding for 2 weeks.  She also complaining of intermittent abdominal pain for several years. She states the pain is worse in her bilateral sides in her lower abdomen.  She feels that it is worse since she started her menstrual cycle.  She states that she is soaking through multiple pads per day.  No fevers, chills.   Vaginal Bleeding Associated symptoms: abdominal pain   Associated symptoms: no dysuria, no fever, no nausea and no vaginal discharge       Home Medications Prior to Admission medications   Medication Sig Start Date End Date Taking? Authorizing Provider  albuterol (VENTOLIN HFA) 108 (90 Base) MCG/ACT inhaler Inhale 2 puffs into the lungs every 4 (four) hours as needed for wheezing or shortness of breath. 04/14/21  Yes Anabel HalonPatel, Rutwik K, MD  benzonatate (TESSALON) 100 MG capsule Take 1 capsule (100 mg total) by mouth 2 (two) times daily as needed for cough. 05/05/21  Yes Anabel HalonPatel, Rutwik K, MD  FLUoxetine (PROZAC) 20 MG capsule Take 1 capsule (20 mg total) by mouth daily. 04/14/21  Yes Anabel HalonPatel, Rutwik K, MD  Iron, Ferrous Sulfate, 325 (65 Fe) MG TABS Take 1 tablet by mouth 2 (two) times daily. 04/14/21  Yes Anabel HalonPatel, Rutwik K, MD  olmesartan-hydrochlorothiazide (BENICAR HCT) 20-12.5 MG tablet TAKE 1 TABLET BY MOUTH DAILY 07/24/21  Yes Anabel HalonPatel, Rutwik K, MD  ondansetron (ZOFRAN ODT) 4 MG disintegrating tablet Take 1 tablet (4 mg total) by mouth every 8 (eight) hours as needed for nausea or vomiting. 01/25/21  Yes Long, Arlyss RepressJoshua G, MD  triamcinolone cream (KENALOG) 0.1 % Apply 1 application topically 2 (two) times daily. 04/14/21  Yes Anabel HalonPatel, Rutwik K, MD   budesonide-formoterol [facility](SYMBICORT) 80-4.5 MCG/ACT inhaler Inhale 2 puffs into the lungs 2 (two) times daily. Patient not taking: Reported on 08/14/2021 04/14/21   Anabel HalonPatel, Rutwik K, MD  medroxyPROGESTERone (PROVERA) 10 MG tablet Take 2 tablets (20 mg total) by mouth 3 (three) times daily for 7 days. 08/14/21 08/21/21  Athan Casalino T, PA-C  progesterone (PROMETRIUM) 200 MG capsule Take 1 capsule (200 mg total) by mouth daily. Patient not taking: Reported on 08/14/2021 08/15/20   Wilson SingerGosrani, Nimish C, MD      Allergies    Penicillins    Review of Systems   Review of Systems  Constitutional:  Negative for chills and fever.  Gastrointestinal:  Positive for abdominal pain. Negative for constipation, diarrhea, nausea and vomiting.  Genitourinary:  Positive for vaginal bleeding. Negative for dysuria, frequency, pelvic pain, urgency, vaginal discharge and vaginal pain.  All other systems reviewed and are negative.  Physical Exam Updated Vital Signs BP 137/87    Pulse 76    Temp 98.6 F (37 C) (Oral)    Resp 19    Ht 5\' 9"  (1.753 m)    Wt 113.4 kg    LMP 07/29/2021    SpO2 97%    BMI 36.92 kg/m  Physical Exam Vitals and nursing note reviewed.  Constitutional:      Appearance: Normal appearance.  HENT:     Head: Normocephalic and atraumatic.  Eyes:     Conjunctiva/sclera: Conjunctivae normal.  Cardiovascular:  Rate and Rhythm: Normal rate and regular rhythm.  Pulmonary:     Effort: Pulmonary effort is normal. No respiratory distress.     Breath sounds: Normal breath sounds.  Abdominal:     General: There is no distension.     Palpations: Abdomen is soft.     Tenderness: There is abdominal tenderness. There is no right CVA tenderness, left CVA tenderness, guarding or rebound.     Comments: Right and left lateral abdominal tenderness to palpation  Skin:    General: Skin is warm and dry.  Neurological:     General: No focal deficit present.     Mental Status: She is alert.    ED Results /  Procedures / Treatments   Labs (all labs ordered are listed, but only abnormal results are displayed) Labs Reviewed  CBC WITH DIFFERENTIAL/PLATELET - Abnormal; Notable for the following components:      Result Value   Hemoglobin 9.6 (*)    HCT 33.1 (*)    MCV 72.9 (*)    MCH 21.1 (*)    MCHC 29.0 (*)    RDW 19.9 (*)    All other components within normal limits  BASIC METABOLIC PANEL - Abnormal; Notable for the following components:   Calcium 7.8 (*)    All other components within normal limits  URINALYSIS, ROUTINE W REFLEX MICROSCOPIC  I-STAT BETA HCG BLOOD, ED ([facility], [facility], [facility] ONLY)    EKG None  Radiology CT ABDOMEN PELVIS W CONTRAST  Result Date: 08/14/2021 CLINICAL DATA:  Vaginal bleeding.  Abdominal pain. EXAM: CT ABDOMEN AND PELVIS WITH CONTRAST TECHNIQUE: Multidetector CT imaging of the abdomen and pelvis was performed using the standard protocol following bolus administration of intravenous contrast. RADIATION DOSE REDUCTION: This exam was performed according to the departmental dose-optimization program which includes automated exposure control, adjustment of the mA and/or kV according to patient size and/or use of iterative reconstruction technique. CONTRAST:  OMNIPAQUE IOHEXOL 300 MG/ML  SOLN COMPARISON:  09/10/2020 pelvic ultrasound.  Prior CT of 06/23/2003. FINDINGS: Lower chest: Bibasilar atelectasis. Mild cardiomegaly, without pericardial or pleural effusion. Incompletely imaged pericardial cyst at 1.7 cm, present in 2005. Hepatobiliary: Hepatomegaly at 20.1 cm craniocaudal. Too small to characterize right hepatic lobe lesion. Normal gallbladder, without biliary ductal dilatation. Pancreas: Normal, without mass or ductal dilatation. Spleen: Normal in size, without focal abnormality. Adrenals/Urinary Tract: Normal adrenal glands. Normal kidneys, without hydronephrosis. Normal urinary bladder. Stomach/Bowel: Normal stomach, without wall thickening. Scattered colonic diverticula.  Colonic stool burden suggests constipation. Normal terminal ileum and appendix. Normal small bowel. Vascular/Lymphatic: Normal caliber of the aorta and branch vessels. No abdominopelvic adenopathy. Reproductive: Bilateral tubal ligation. Normal uterus for age. Right adnexal fluid density lesion measures on the order of 6.7 x 5.6x 5.6 Cm including on 71/2. 8.1 x 6.0 x 6.4 cm cyst on the 09/10/2020 ultrasound. Other: No significant free fluid. Mild pelvic floor laxity. No free intraperitoneal air. Musculoskeletal: No acute osseous abnormality. Disc bulges at L4-5 and L5-S1. IMPRESSION: 1. No acute process in the abdomen or pelvis. No explanation for vaginal bleeding. 2.  Possible constipation. 3. Hepatomegaly. 4. Right adnexal cystic lesion, grossly similar in size to the 09/10/2020 ultrasound. As on that report, per consensus criteria, further evaluation with ultrasound or nonemergent MRI should be considered. This recommendation follows ACR consensus guidelines: White Paper of the ACR Incidental Findings Committee II on Adnexal Findings. J Am Coll Radiol [phone]. Electronically Signed   By: Jeronimo Greaves M.D.   On: 08/14/2021  15:18    Procedures Procedures    Medications Ordered in ED Medications  iohexol (OMNIPAQUE) 300 MG/ML solution 100 mL (100 mLs Intravenous Contrast Given 08/14/21 1458)    ED Course/ Medical Decision Making/ A&P                           Medical Decision Making Amount and/or Complexity of Data Reviewed Labs: ordered. Radiology: ordered.  Risk Prescription drug management.   This patient is a 41 year old female who presents to the ED for concern of vaginal bleeding.   Differential diagnoses prior to evaluation: The emergent differential diagnosis includes, but is not limited to,  Abnormal uterine bleeding, threatened miscarriage, incomplete miscarriage, normal bleeding from an early trimester pregnancy, ectopic pregnancy, vaginal/cervical trauma, subchorionic  hemorrhage/hematoma, etc.   This is not an exhaustive differential.   Past Medical History / Co-morbidities: PCOS, iron deficiency anemia, hypertension, asthma  Physical Exam: Physical exam performed. The pertinent findings include: Patient is afebrile, not tachycardic, not hypoxic, no acute distress.  Abdomen is soft, tenderness to palpation of the right and left lateral abdomen.  No guarding or rigidity.  Overall nonsurgical abdomen.  Deferred GU exam due to patient preference.  Lab Tests/Imaging studies: I Ordered, and personally interpreted labs/imaging including CBC, BMP, beta-hCG, urinalysis, and CT abdomen pelvis. The pertinent results include:  Hemoglobin 9.6 which appears improved compared to prior.  No leukocytosis.  Electrolytes within normal limits.  Negative pregnancy test.  Patient was unable to provide a urine sample during her 5 and half hours in the [facility].  CT abdomen pelvis showed some mild constipation, and a right adnexal cystic lesion.  This appeared similar in size to an ultrasound performed in 2022. I agree with the radiologist interpretation.   Disposition: After consideration of the diagnostic results and the patients response to treatment, I feel that patient's not requiring admission or inpatient treatment for her symptoms.  I am reassured by the fact that her hemoglobin is improved compared to her baseline.  She is not requiring a blood transfusion today.  She adamantly denies any pelvic trauma, or pelvic pain.  I have very low suspicion for acute ovarian torsion, due to the persistence of her pain over several weeks to months.  She states that she has had long menstrual cycles like this in the past, and is usually managed by medication from her primary doctor.  Therefore think her symptoms are likely related to her PCOS and abnormal uterine bleeding.  We will treat with medroxyprogesterone and refer her to the gynecologist. Discussed reasons to return to the  emergency [facility], and the patient is agreeable to the plan.   Final Clinical Impression(s) / ED Diagnoses Final diagnoses:  Abnormal uterine bleeding    Rx / DC Orders ED Discharge Orders          Ordered    medroxyPROGESTERone (PROVERA) 10 MG tablet  3 times daily,   Status:  Discontinued        08/14/21 1547    Ambulatory referral to Obstetrics / Gynecology        08/14/21 1547    medroxyPROGESTERone (PROVERA) 10 MG tablet  3 times daily        08/14/21 1605           Portions of this report may have been transcribed using voice recognition software. Every effort was made to ensure accuracy; however, inadvertent computerized transcription errors may be present.    Shariff Lasky,  Shivan Hodes T, PA-C 08/14/21 1618    Benjiman Core, MD 08/14/21 [phone]

## 2021-08-28 ENCOUNTER — Other Ambulatory Visit: Payer: Self-pay | Admitting: Internal Medicine

## 2021-08-28 DIAGNOSIS — F418 Other specified anxiety disorders: Secondary | ICD-10-CM

## 2021-09-03 ENCOUNTER — Ambulatory Visit: Payer: Medicaid Other | Admitting: Internal Medicine

## 2021-09-10 ENCOUNTER — Encounter: Payer: Medicaid Other | Admitting: Adult Health

## 2021-09-22 ENCOUNTER — Telehealth: Payer: Self-pay

## 2021-09-22 NOTE — Telephone Encounter (Signed)
Medication sent to pharmacy 3/23 with refills ?

## 2021-09-22 NOTE — Telephone Encounter (Signed)
Patient needs refill on FLUoxetine (PROZAC) 20 MG capsule  , please send to [facility] [address]  ?

## 2021-10-07 ENCOUNTER — Ambulatory Visit: Payer: Medicaid Other | Admitting: Internal Medicine

## 2022-04-07 IMAGING — CT CT ABD-PELV W/ CM
2 of 4 series · 16 of 46 positions shown, 18 images · IV contrast (agent unspecified)
Comparison: 09/10/2020 pelvic ultrasound.  Prior CT of 06/23/2003.

CLINICAL DATA: Vaginal bleeding.  Abdominal pain.

EXAM:
CT ABDOMEN AND PELVIS WITH CONTRAST
TECHNIQUE: Multidetector CT imaging of the abdomen and pelvis was performed
using the standard protocol following bolus administration of
intravenous contrast.

[Series 2: axial st · axial · 0.98mm/px · z∈[-531,-106]mm · 13 of 95 slices shown, 15 images]
[im 5/95  soft-tissue]
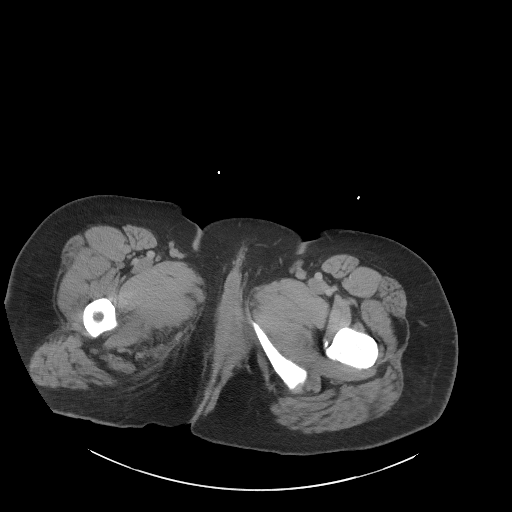
[im 5/95  bone]
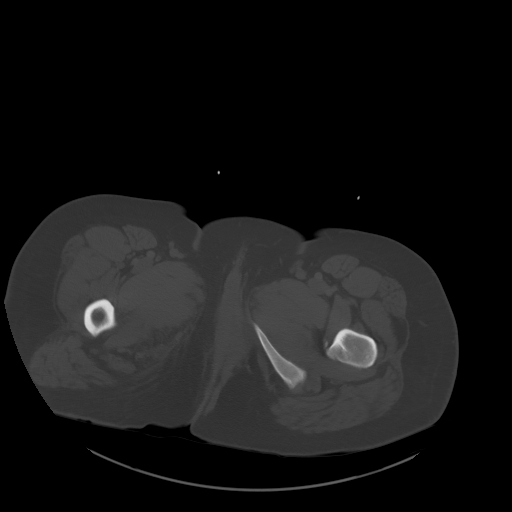
[im 13/95  soft-tissue]
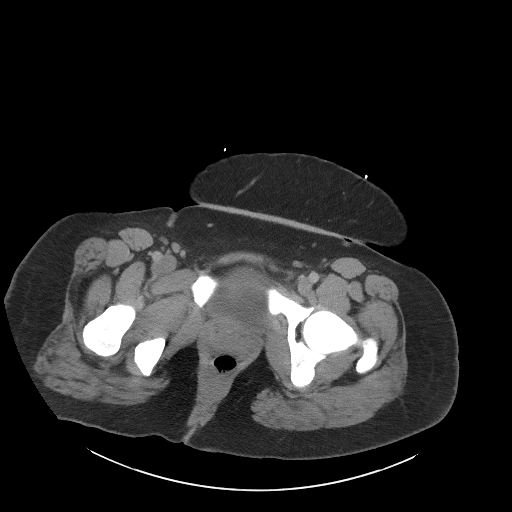
[im 21/95  soft-tissue]
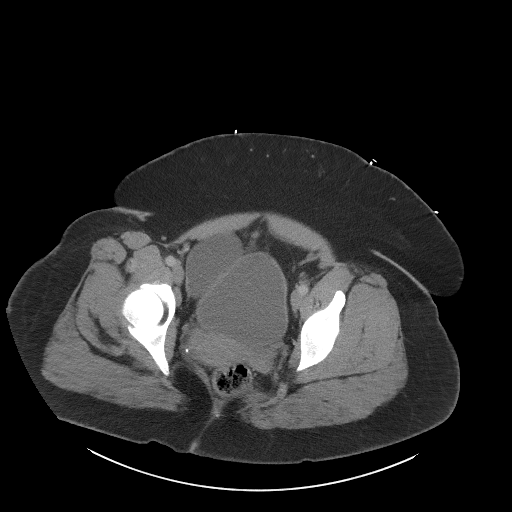
[im 25/95  soft-tissue]
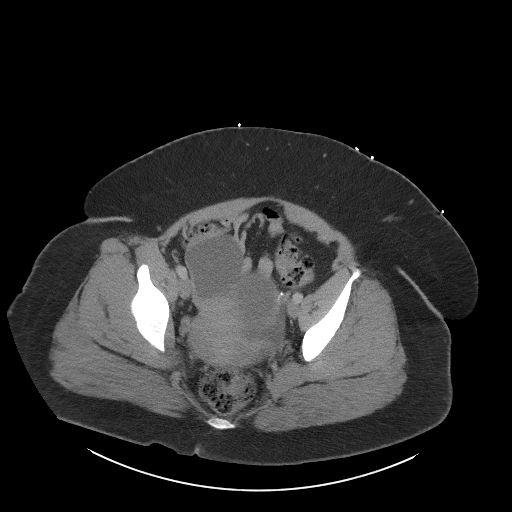
[im 33/95  soft-tissue]
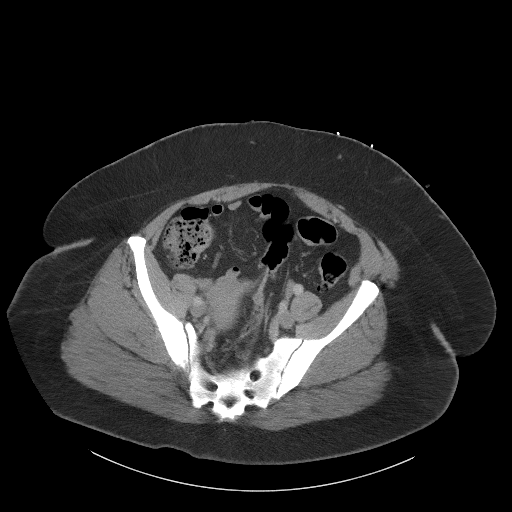
[im 41/95  soft-tissue]
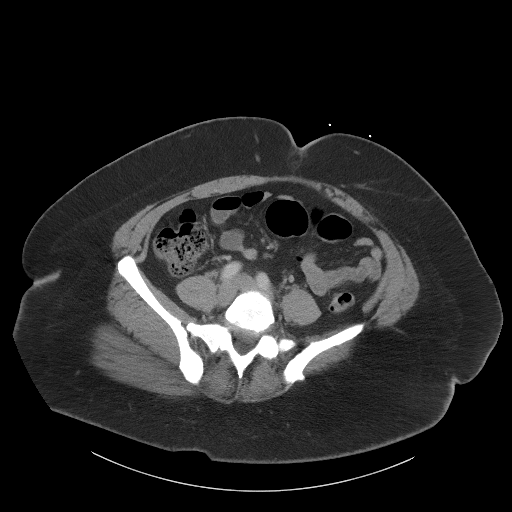
[im 50/95  soft-tissue]
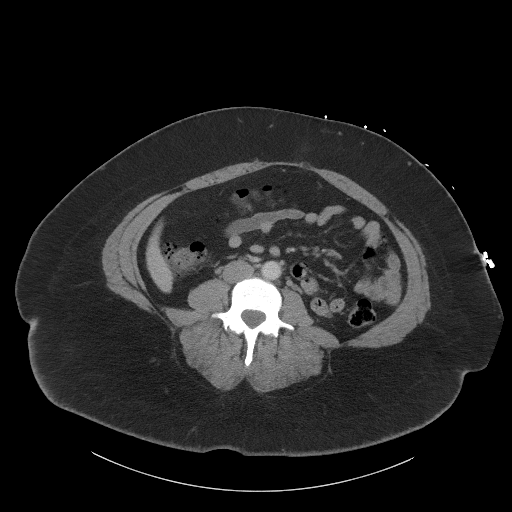
[im 54/95  soft-tissue]
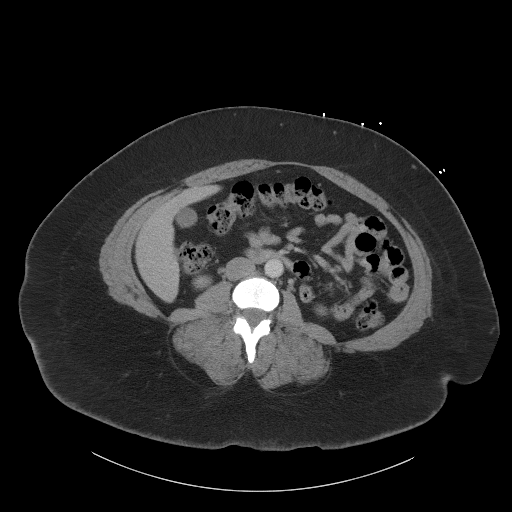
[im 62/95  soft-tissue]
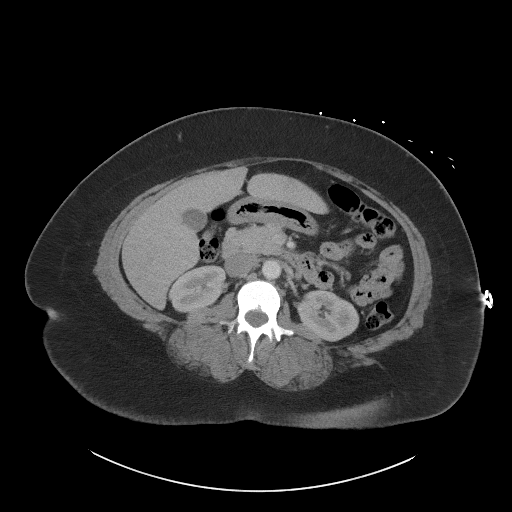
[im 62/95  bone]
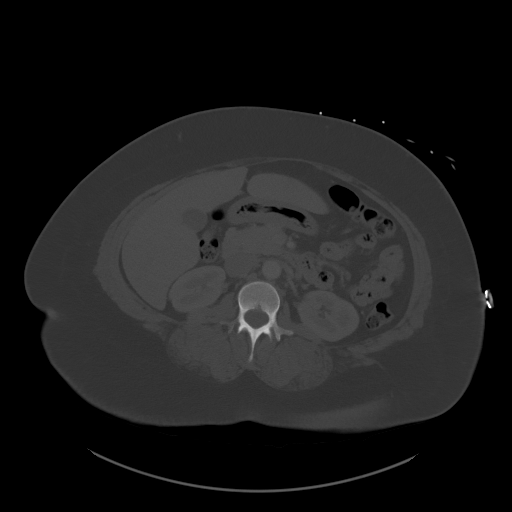
[im 70/95  soft-tissue]
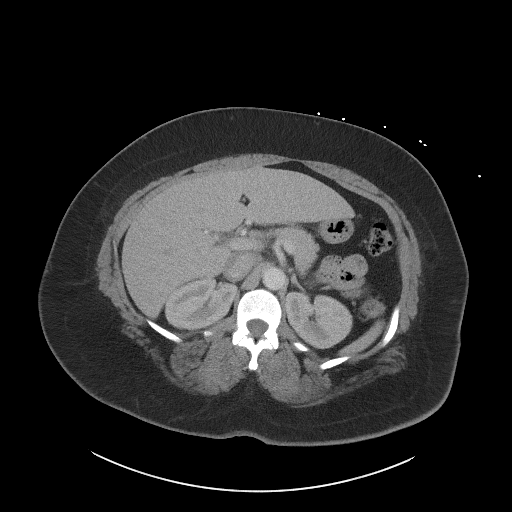
[im 74/95  soft-tissue]
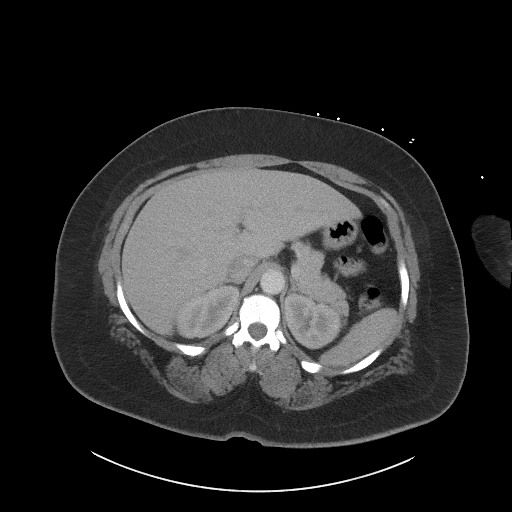
[im 82/95  soft-tissue]
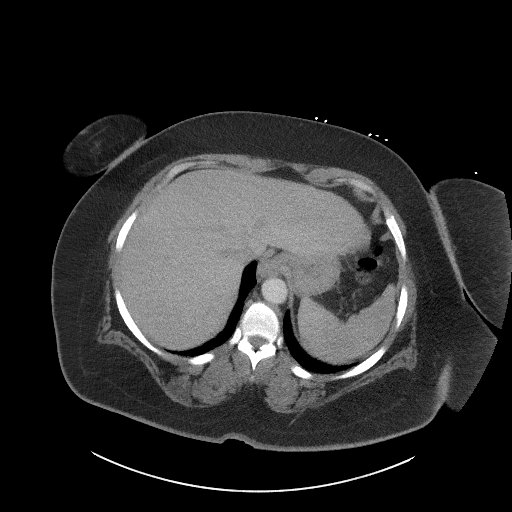
[im 90/95  soft-tissue]
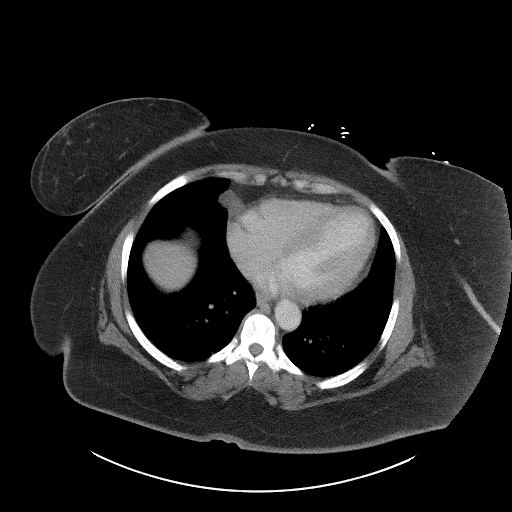

[Series 5: coronal st · coronal · 0.76mm/px · 3 of 117 slices shown]
[im 39/117  soft-tissue]
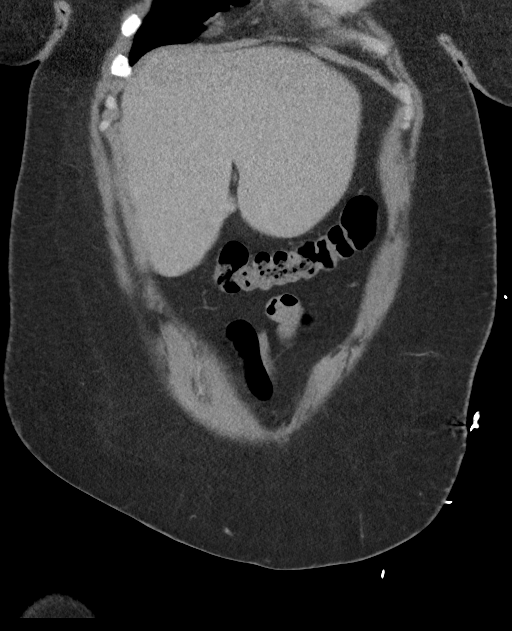
[im 52/117  soft-tissue]
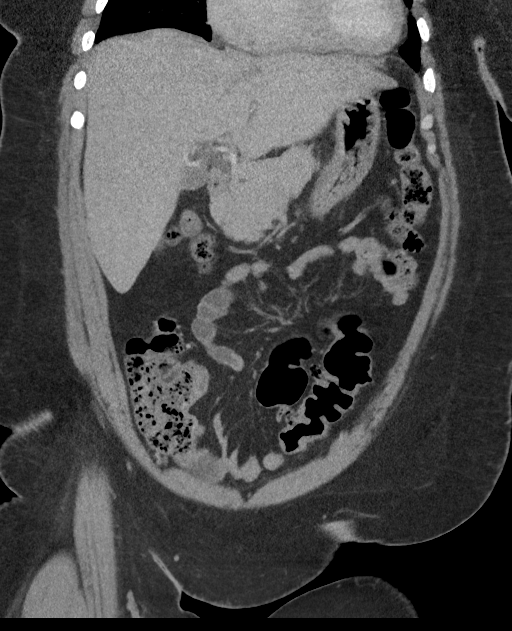
[im 65/117  soft-tissue]
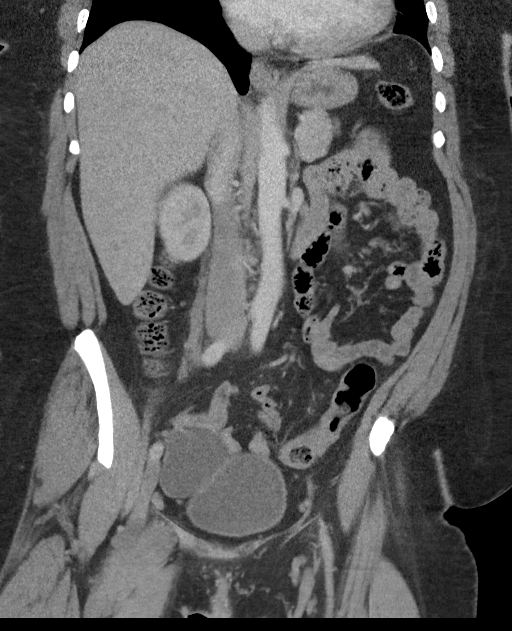

[16 of 46 positions shown; findings below may reference images not displayed]

RADIATION DOSE REDUCTION: This exam was performed according to the
departmental dose-optimization program which includes automated
exposure control, adjustment of the mA and/or kV according to
patient size and/or use of iterative reconstruction technique.

CONTRAST:  100mL OMNIPAQUE IOHEXOL 300 MG/ML  SOLN
FINDINGS: Lower chest: Bibasilar atelectasis. Mild cardiomegaly, without
pericardial or pleural effusion. Incompletely imaged pericardial
cyst at 1.7 cm, present in 4448.

Hepatobiliary: Hepatomegaly at 20.1 cm craniocaudal. Too small to
characterize right hepatic lobe lesion. Normal gallbladder, without
biliary ductal dilatation.

Pancreas: Normal, without mass or ductal dilatation.

Spleen: Normal in size, without focal abnormality.

Adrenals/Urinary Tract: Normal adrenal glands. Normal kidneys,
without hydronephrosis. Normal urinary bladder.

Stomach/Bowel: Normal stomach, without wall thickening. Scattered
colonic diverticula. Colonic stool burden suggests constipation.
Normal terminal ileum and appendix. Normal small bowel.

Vascular/Lymphatic: Normal caliber of the aorta and branch vessels.
No abdominopelvic adenopathy.

Reproductive: Bilateral tubal ligation. Normal uterus for age. Right
adnexal fluid density lesion measures on the order of 6.7 x 5.6x
Cm including on 71/2. 8.1 x 6.0 x 6.4 cm cyst on the 09/10/2020
ultrasound.

Other: No significant free fluid. Mild pelvic floor laxity. No free
intraperitoneal air.

Musculoskeletal: No acute osseous abnormality. Disc bulges at L4-5
and L5-S1.
IMPRESSION: 1. No acute process in the abdomen or pelvis. No explanation for
vaginal bleeding.
2.  Possible constipation.
3. Hepatomegaly.
4. Right adnexal cystic lesion, grossly similar in size to the
09/10/2020 ultrasound. As on that report, per consensus criteria,
further evaluation with ultrasound or nonemergent MRI should be
considered. This recommendation follows ACR consensus guidelines:
White Paper of the ACR Incidental Findings Committee II on Adnexal
Findings. [HOSPITAL] [DATE].

## 2022-08-28 ENCOUNTER — Other Ambulatory Visit: Payer: Self-pay | Admitting: Internal Medicine

## 2022-08-28 ENCOUNTER — Encounter: Payer: Self-pay | Admitting: Internal Medicine

## 2022-08-28 ENCOUNTER — Ambulatory Visit: Payer: Medicaid Other | Admitting: Internal Medicine

## 2022-08-28 VITALS — BP 172/104 | HR 81 | Ht 70.0 in | Wt 302.8 lb

## 2022-08-28 DIAGNOSIS — L309 Dermatitis, unspecified: Secondary | ICD-10-CM

## 2022-08-28 DIAGNOSIS — F418 Other specified anxiety disorders: Secondary | ICD-10-CM | POA: Diagnosis not present

## 2022-08-28 DIAGNOSIS — J069 Acute upper respiratory infection, unspecified: Secondary | ICD-10-CM

## 2022-08-28 DIAGNOSIS — E559 Vitamin D deficiency, unspecified: Secondary | ICD-10-CM

## 2022-08-28 DIAGNOSIS — J4530 Mild persistent asthma, uncomplicated: Secondary | ICD-10-CM

## 2022-08-28 DIAGNOSIS — M545 Low back pain, unspecified: Secondary | ICD-10-CM

## 2022-08-28 DIAGNOSIS — G8929 Other chronic pain: Secondary | ICD-10-CM

## 2022-08-28 DIAGNOSIS — H6123 Impacted cerumen, bilateral: Secondary | ICD-10-CM

## 2022-08-28 DIAGNOSIS — R7303 Prediabetes: Secondary | ICD-10-CM

## 2022-08-28 DIAGNOSIS — E782 Mixed hyperlipidemia: Secondary | ICD-10-CM

## 2022-08-28 DIAGNOSIS — Z1159 Encounter for screening for other viral diseases: Secondary | ICD-10-CM

## 2022-08-28 DIAGNOSIS — I10 Essential (primary) hypertension: Secondary | ICD-10-CM

## 2022-08-28 MED ORDER — OLMESARTAN MEDOXOMIL-HCTZ 20-12.5 MG PO TABS: 1.0000 | ORAL_TABLET | Freq: Every day | ORAL | 0 refills | Status: DC

## 2022-08-28 MED ORDER — BENZONATATE 100 MG PO CAPS: 100.0000 mg | ORAL_CAPSULE | Freq: Two times a day (BID) | ORAL | 0 refills | Status: DC | PRN

## 2022-08-28 MED ORDER — NAPROXEN 500 MG PO TABS: 500.0000 mg | ORAL_TABLET | Freq: Two times a day (BID) | ORAL | 0 refills | Status: DC | PRN

## 2022-08-28 MED ORDER — BUDESONIDE-FORMOTEROL FUMARATE 80-4.5 MCG/ACT IN AERO: 2.0000 | INHALATION_SPRAY | Freq: Two times a day (BID) | RESPIRATORY_TRACT | 3 refills | Status: AC

## 2022-08-28 MED ORDER — ALBUTEROL SULFATE HFA 108 (90 BASE) MCG/ACT IN AERS: 2.0000 | INHALATION_SPRAY | RESPIRATORY_TRACT | 3 refills | Status: AC | PRN

## 2022-08-28 MED ORDER — TRIAMCINOLONE ACETONIDE 0.1 % EX CREA: 1.0000 | TOPICAL_CREAM | Freq: Two times a day (BID) | CUTANEOUS | 0 refills | Status: DC

## 2022-08-28 MED ORDER — FLUOXETINE HCL 20 MG PO CAPS: 20.0000 mg | ORAL_CAPSULE | Freq: Every day | ORAL | 3 refills | Status: DC

## 2022-08-28 NOTE — Assessment & Plan Note (Signed)
Bilateral ear irrigation done today She has dried out wax, was unable to irrigate completely Advised to use Debrox eardrops for excess earwax If persistent symptoms, will need ENT evaluation

## 2022-08-28 NOTE — Patient Instructions (Addendum)
Please start taking olmesartan-HCTZ 20-12.5 mg once daily for the blood pressure.  Please start taking Prozac for anxiety.  Please use Symbicort regularly and use albuterol as needed for shortness of breath or wheezing.  Please follow DASH diet and perform moderate exercise/walking at least 150 minutes/week.  Please take naproxen as needed for back pain.  Okay to use Debrox ear drops for excess ear wax.  Please do not use any sharp objects for cleaning purposes.

## 2022-08-28 NOTE — Assessment & Plan Note (Signed)
Lab Results  Component Value Date   HGBA1C 5.4 07/25/2020   Needs to follow low carbohydrate diet

## 2022-08-28 NOTE — Assessment & Plan Note (Signed)
Low back pain likely due to muscular strain and/or DDD of lumbar spine Advised to avoid heavy lifting and frequent bending Naproxen as needed for pain Simple back exercises-material provided

## 2022-08-28 NOTE — Progress Notes (Signed)
Established Patient Office Visit  Subjective:  Patient ID: Sherri Campbell, female    DOB: 03/01/1981  Age: 42 y.o. MRN: [id number]  CC:  Chief Complaint  Patient presents with   Hypertension    Follow up for hypertension. Patient states she is having sharp pains in her side and back. She also feels liher ears are stopped up.    HPI [name] is a 42 y.o. female with past medical history of HTN, asthma, menorrhagia with irregular bleeding, hirsutism, IDA and morbid obesity who presents for f/u of her chronic medical conditions.  She has not had follow up since 11/22.  HTN: Her BP was elevated in the office today. Of note, she has run out of her olmesartan-HCTZ.  She complains of generalized headache, which is chronic and intermittent.  She also complains of dizziness, which is intermittent. Denies any vision changes, chest pain or palpitations.  Of note, she has bilateral ear clogging sensation and decreased hearing for the last 2 weeks.  Denies any ear pain or discharge currently.  Denies any tinnitus.  She has mild dizziness, which is intermittent.  Denies any fever, chills, nausea or vomiting.  Asthma: She uses Symbicort inhaler for dyspnea or wheezing,   Denies any fever, chills or sore throat currently.   She has a history of anxiety with depression, for which she used to take Prozac 20 mg daily.  She has run out of Prozac currently.  She has been stressed lately as she is taking care of her grandson and her daughter (has mental disorder).  She denies any SI or HI currently.  She also complains of low back pain, radiating to the sides, since 06/29/22.  Denies any numbness or tingling of the LE.  Denies any recent injury.  Denies any dysuria, hematuria or urinary hesitance or resistance.  Past Medical History:  Diagnosis Date   ADHD (attention deficit hyperactivity disorder)    Asthma    Cervical high risk HPV (human papillomavirus) test positive 09/19/2020   Depression     Hypertension    Seasonal allergies     Past Surgical History:  Procedure Laterality Date   ovarian cyst removed     TONSILLECTOMY     TUBAL LIGATION      Family History  Problem Relation Age of Onset   Heart disease Mother    Hypertension Mother    Diabetes Father    Hypertension Father    Diabetes Brother     Social History   Socioeconomic History   Marital status: Single    Spouse name: Not on file   Number of children: Not on file   Years of education: Not on file   Highest education level: Not on file  Occupational History   Not on file  Tobacco Use   Smoking status: Never   Smokeless tobacco: Never  Vaping Use   Vaping Use: Never used  Substance and Sexual Activity   Alcohol use: Yes    Comment: occ   Drug use: No   Sexual activity: Yes    Birth control/protection: Surgical    Comment: tubal  Other Topics Concern   Not on file  Social History Narrative   Engaged,lives with fiancee and one daughter.Unemployed.   Social Determinants of Health   Financial Resource Strain: Medium Risk (09/05/2020)   Overall Financial Resource Strain (CARDIA)    Difficulty of Paying Living Expenses: Somewhat hard  Food Insecurity: Food Insecurity Present (09/05/2020)   Hunger Vital  Sign    Worried About Charity fundraiser in the Last Year: Sometimes true    Ran Out of Food in the Last Year: Sometimes true  Transportation Needs: No Transportation Needs (09/05/2020)   PRAPARE - Hydrologist (Medical): No    Lack of Transportation (Non-Medical): No  Physical Activity: Insufficiently Active (09/05/2020)   Exercise Vital Sign    Days of Exercise per Week: 3 days    Minutes of Exercise per Session: 30 min  Stress: No Stress Concern Present (09/05/2020)   [place]    Feeling of Stress : Only a little  Social Connections: Moderately Integrated (09/05/2020)   Social Connection and  Isolation Panel [NHANES]    Frequency of Communication with Friends and Family: Once a week    Frequency of Social Gatherings with Friends and Family: Three times a week    Attends Religious Services: 1 to 4 times per year    Active Member of Clubs or Organizations: No    Attends Archivist Meetings: Never    Marital Status: Living with partner  Intimate Partner Violence: Not At Risk (09/05/2020)   Humiliation, Afraid, Rape, and Kick questionnaire    Fear of Current or Ex-Partner: No    Emotionally Abused: No    Physically Abused: No    Sexually Abused: No    Outpatient Medications Prior to Visit  Medication Sig Dispense Refill   benzonatate (TESSALON) 100 MG capsule Take 1 capsule (100 mg total) by mouth 2 (two) times daily as needed for cough. 20 capsule 0   Iron, Ferrous Sulfate, 325 (65 Fe) MG TABS Take 1 tablet by mouth 2 (two) times daily. 60 tablet 3   medroxyPROGESTERone (PROVERA) 10 MG tablet Take 2 tablets (20 mg total) by mouth 3 (three) times daily for 7 days. 42 tablet 0   ondansetron (ZOFRAN ODT) 4 MG disintegrating tablet Take 1 tablet (4 mg total) by mouth every 8 (eight) hours as needed for nausea or vomiting. 20 tablet 0   progesterone (PROMETRIUM) 200 MG capsule Take 1 capsule (200 mg total) by mouth daily. (Patient not taking: Reported on 08/14/2021) 30 capsule 3   triamcinolone cream (KENALOG) 0.1 % Apply 1 application topically 2 (two) times daily. 30 g 0   albuterol (VENTOLIN HFA) 108 (90 Base) MCG/ACT inhaler Inhale 2 puffs into the lungs every 4 (four) hours as needed for wheezing or shortness of breath. 8 g 3   budesonide-formoterol (SYMBICORT) 80-4.5 MCG/ACT inhaler Inhale 2 puffs into the lungs 2 (two) times daily. (Patient not taking: Reported on 08/14/2021) 1 each 3   FLUoxetine (PROZAC) 20 MG capsule TAKE 1 CAPSULE(20 MG) BY MOUTH DAILY 30 capsule 3   olmesartan-hydrochlorothiazide (BENICAR HCT) 20-12.5 MG tablet TAKE 1 TABLET BY MOUTH DAILY 90 tablet 0    No facility-administered medications prior to visit.    Allergies  Allergen Reactions   Penicillins     ROS Review of Systems  Constitutional:  Negative for chills and fever.  HENT:  Negative for congestion, sinus pressure, sinus pain and sore throat.   Eyes:  Negative for pain and discharge.  Respiratory:  Negative for cough, shortness of breath and wheezing.   Cardiovascular:  Negative for chest pain and palpitations.  Gastrointestinal:  Negative for abdominal pain, diarrhea, nausea and vomiting.  Endocrine: Negative for polydipsia and polyuria.  Genitourinary:  Negative for dysuria and hematuria.  Musculoskeletal:  Negative for  neck pain and neck stiffness.  Skin:  Positive for rash.  Neurological:  Positive for dizziness and headaches. Negative for weakness.  Psychiatric/Behavioral:  Positive for sleep disturbance. Negative for agitation and behavioral problems. The patient is nervous/anxious.       Objective:    Physical Exam Vitals reviewed.  Constitutional:      General: She is not in acute distress.    Appearance: She is obese. She is not diaphoretic.  HENT:     Head: Normocephalic and atraumatic.     Nose: Nose normal.     Mouth/Throat:     Mouth: Mucous membranes are moist.  Eyes:     General: No scleral icterus.    Extraocular Movements: Extraocular movements intact.  Cardiovascular:     Rate and Rhythm: Normal rate and regular rhythm.     Pulses: Normal pulses.     Heart sounds: Normal heart sounds. No murmur heard. Pulmonary:     Breath sounds: Normal breath sounds. No wheezing or rales.  Musculoskeletal:     Cervical back: Neck supple. No tenderness.     Right lower leg: No edema.     Left lower leg: No edema.  Skin:    General: Skin is warm.     Findings: Rash (Erythematous plaques over b/l UE, some lichenified plaques) present.  Neurological:     General: No focal deficit present.     Mental Status: She is alert and oriented to person, place,  and time.     Sensory: No sensory deficit.     Motor: No weakness.  Psychiatric:        Mood and Affect: Mood normal.        Behavior: Behavior normal.     BP (!) 172/104 (BP Location: Right Arm, Cuff Size: Normal)   Pulse 81   Ht 5\' 10"  (1.778 m)   Wt (!) 302 lb 12.8 oz (137.3 kg)   SpO2 93%   BMI 43.45 kg/m  Wt Readings from Last 3 Encounters:  08/28/22 (!) 302 lb 12.8 oz (137.3 kg)  08/14/21 250 lb (113.4 kg)  04/14/21 287 lb 0.6 oz (130.2 kg)    Lab Results  Component Value Date   TSH 3.07 07/25/2020   Lab Results  Component Value Date   WBC 4.7 08/14/2021   HGB 9.6 (L) 08/14/2021   HCT 33.1 (L) 08/14/2021   MCV 72.9 (L) 08/14/2021   PLT 378 08/14/2021   Lab Results  Component Value Date   NA 135 08/14/2021   K 4.0 08/14/2021   CO2 23 08/14/2021   GLUCOSE 95 08/14/2021   BUN 13 08/14/2021   CREATININE 0.60 08/14/2021   BILITOT 0.5 01/24/2021   ALKPHOS 131 (H) 01/24/2021   AST 30 01/24/2021   ALT 43 01/24/2021   PROT 7.6 01/24/2021   ALBUMIN 3.5 01/24/2021   CALCIUM 7.8 (L) 08/14/2021   ANIONGAP 9 08/14/2021   Lab Results  Component Value Date   CHOL 203 (H) 07/25/2020   Lab Results  Component Value Date   HDL 48 (L) 07/25/2020   Lab Results  Component Value Date   LDLCALC 130 (H) 07/25/2020   Lab Results  Component Value Date   TRIG 137 07/25/2020   Lab Results  Component Value Date   CHOLHDL 4.2 07/25/2020   Lab Results  Component Value Date   HGBA1C 5.4 07/25/2020      Assessment & Plan:   Problem List Items Addressed This Visit  Cardiovascular and Mediastinum   Essential hypertension - Primary    BP Readings from Last 1 Encounters:  08/28/22 (!) 172/104  uncontrolled due to noncompliance Started olmesartan HCTZ 20-12.5 mg QD again, adjust dose according to response Counseled for compliance with the medications Advised DASH diet and moderate exercise/walking, at least 150 mins/week  EKG: Sinus rhythm. No signs of  active ischemia.       Relevant Medications   olmesartan-hydrochlorothiazide (BENICAR HCT) 20-12.5 MG tablet   Other Relevant Orders   TSH   CBC with Differential/Platelet   EKG 12-Lead (Completed)     Respiratory   Asthma    Refilled albuterol as needed for dyspnea or wheezing Refilled Symbicort If persistent, will refer to pulmonology      Relevant Medications   budesonide-formoterol (SYMBICORT) 80-4.5 MCG/ACT inhaler   albuterol (VENTOLIN HFA) 108 (90 Base) MCG/ACT inhaler     Nervous and Auditory   Bilateral impacted cerumen    Bilateral ear irrigation done today She has dried out wax, was unable to irrigate completely Advised to use Debrox eardrops for excess earwax If persistent symptoms, will need ENT evaluation         Other   Morbid obesity ([address])    Diet modification and moderate exercise/walking advised      Prediabetes    Lab Results  Component Value Date   HGBA1C 5.4 07/25/2020  Needs to follow low carbohydrate diet      Relevant Orders   Hemoglobin A1c   CMP14+EGFR   Depression with anxiety    Was well controlled with Prozac Refilled Prozac now      Relevant Medications   FLUoxetine (PROZAC) 20 MG capsule   Other Relevant Orders   TSH   CBC with Differential/Platelet   Chronic bilateral low back pain    Low back pain likely due to muscular strain and/or DDD of lumbar spine Advised to avoid heavy lifting and frequent bending Naproxen as needed for pain Simple back exercises-material provided      Relevant Medications   FLUoxetine (PROZAC) 20 MG capsule   naproxen (NAPROSYN) 500 MG tablet   Other Visit Diagnoses     Mixed hyperlipidemia       Relevant Medications   olmesartan-hydrochlorothiazide (BENICAR HCT) 20-12.5 MG tablet   Other Relevant Orders   Lipid panel   Vitamin D deficiency       Relevant Orders   VITAMIN D 25 Hydroxy (Vit-D Deficiency, Fractures)   Need for hepatitis C screening test       Relevant Orders    Hepatitis C Antibody       Meds ordered this encounter  Medications   FLUoxetine (PROZAC) 20 MG capsule    Sig: Take 1 capsule (20 mg total) by mouth daily.    Dispense:  30 capsule    Refill:  3   olmesartan-hydrochlorothiazide (BENICAR HCT) 20-12.5 MG tablet    Sig: Take 1 tablet by mouth daily.    Dispense:  30 tablet    Refill:  0   budesonide-formoterol (SYMBICORT) 80-4.5 MCG/ACT inhaler    Sig: Inhale 2 puffs into the lungs 2 (two) times daily.    Dispense:  1 each    Refill:  3   albuterol (VENTOLIN HFA) 108 (90 Base) MCG/ACT inhaler    Sig: Inhale 2 puffs into the lungs every 4 (four) hours as needed for wheezing or shortness of breath.    Dispense:  18 g    Refill:  3  naproxen (NAPROSYN) 500 MG tablet    Sig: Take 1 tablet (500 mg total) by mouth 2 (two) times daily as needed for mild pain or moderate pain.    Dispense:  30 tablet    Refill:  0    Follow-up: Return in about 3 weeks (around 09/18/2022) for HTN.    Lindell Spar, MD

## 2022-08-28 NOTE — Assessment & Plan Note (Addendum)
BP Readings from Last 1 Encounters:  08/28/22 (!) 172/104   uncontrolled due to noncompliance Started olmesartan HCTZ 20-12.5 mg QD again, adjust dose according to response Counseled for compliance with the medications Advised DASH diet and moderate exercise/walking, at least 150 mins/week  EKG: Sinus rhythm. No signs of active ischemia.

## 2022-08-28 NOTE — Assessment & Plan Note (Addendum)
Diet modification and moderate exercise/walking advised 

## 2022-08-28 NOTE — Assessment & Plan Note (Signed)
Refilled albuterol as needed for dyspnea or wheezing Refilled Symbicort If persistent, will refer to pulmonology

## 2022-08-28 NOTE — Assessment & Plan Note (Addendum)
Was well controlled with Prozac Refilled Prozac now

## 2022-08-29 LAB — LIPID PANEL
Chol/HDL Ratio: 5.7 ratio — ABNORMAL HIGH (ref 0.0–4.4)
Cholesterol, Total: 227 mg/dL — ABNORMAL HIGH (ref 100–199)
HDL: 40 mg/dL (ref >39)
LDL Chol Calc (NIH): 156 mg/dL — ABNORMAL HIGH (ref 0–99)
Triglycerides: 171 mg/dL — ABNORMAL HIGH (ref 0–149)
VLDL Cholesterol Cal: 31 mg/dL (ref 5–40)

## 2022-08-29 LAB — HEMOGLOBIN A1C
Est. average glucose Bld gHb Est-mCnc: 128 mg/dL
Hgb A1c MFr Bld: 6.1 % — ABNORMAL HIGH (ref 4.8–5.6)

## 2022-08-29 LAB — CMP14+EGFR
ALT: 30 IU/L (ref 0–32)
AST: 22 IU/L (ref 0–40)
Albumin/Globulin Ratio: 1.2 (ref 1.2–2.2)
Albumin: 4.2 g/dL (ref 3.9–4.9)
Alkaline Phosphatase: 127 IU/L — ABNORMAL HIGH (ref 44–121)
BUN/Creatinine Ratio: 16 (ref 9–23)
BUN: 12 mg/dL (ref 6–24)
Bilirubin Total: <0.2 mg/dL (ref 0.0–1.2)
CO2: 23 mmol/L (ref 20–29)
Calcium: 8.8 mg/dL (ref 8.7–10.2)
Chloride: 99 mmol/L (ref 96–106)
Creatinine, Ser: 0.73 mg/dL (ref 0.57–1.00)
Globulin, Total: 3.5 g/dL (ref 1.5–4.5)
Glucose: 102 mg/dL — ABNORMAL HIGH (ref 70–99)
Potassium: 4.3 mmol/L (ref 3.5–5.2)
Sodium: 136 mmol/L (ref 134–144)
Total Protein: 7.7 g/dL (ref 6.0–8.5)
eGFR: 106 mL/min/{1.73_m2} (ref >59)

## 2022-08-29 LAB — CBC WITH DIFFERENTIAL/PLATELET
Basophils Absolute: 0 10*3/uL (ref 0.0–0.2)
Basos: 1 %
EOS (ABSOLUTE): 0.1 10*3/uL (ref 0.0–0.4)
Eos: 1 %
Hematocrit: 30.4 % — ABNORMAL LOW (ref 34.0–46.6)
Hemoglobin: 8.9 g/dL — ABNORMAL LOW (ref 11.1–15.9)
Immature Grans (Abs): 0 10*3/uL (ref 0.0–0.1)
Immature Granulocytes: 0 %
Lymphocytes Absolute: 2.4 10*3/uL (ref 0.7–3.1)
Lymphs: 41 %
MCH: 20.2 pg — ABNORMAL LOW (ref 26.6–33.0)
MCHC: 29.3 g/dL — ABNORMAL LOW (ref 31.5–35.7)
MCV: 69 fL — ABNORMAL LOW (ref 79–97)
Monocytes Absolute: 0.5 10*3/uL (ref 0.1–0.9)
Monocytes: 8 %
Neutrophils Absolute: 3 10*3/uL (ref 1.4–7.0)
Neutrophils: 49 %
Platelets: 471 10*3/uL — ABNORMAL HIGH (ref 150–450)
RBC: 4.4 x10E6/uL (ref 3.77–5.28)
RDW: 17.9 % — ABNORMAL HIGH (ref 11.7–15.4)
WBC: 6 10*3/uL (ref 3.4–10.8)

## 2022-08-29 LAB — VITAMIN D 25 HYDROXY (VIT D DEFICIENCY, FRACTURES): Vit D, 25-Hydroxy: 7.4 ng/mL — ABNORMAL LOW (ref 30.0–100.0)

## 2022-08-29 LAB — HEPATITIS C ANTIBODY: Hep C Virus Ab: NONREACTIVE

## 2022-08-29 LAB — TSH: TSH: 4.15 u[IU]/mL (ref 0.450–4.500)

## 2022-09-18 ENCOUNTER — Ambulatory Visit: Payer: Medicaid Other | Admitting: Internal Medicine

## 2022-12-03 ENCOUNTER — Other Ambulatory Visit: Payer: Self-pay | Admitting: Internal Medicine

## 2022-12-03 DIAGNOSIS — I10 Essential (primary) hypertension: Secondary | ICD-10-CM

## 2023-01-04 ENCOUNTER — Ambulatory Visit (INDEPENDENT_AMBULATORY_CARE_PROVIDER_SITE_OTHER): Payer: MEDICAID | Admitting: Internal Medicine

## 2023-01-04 ENCOUNTER — Encounter: Payer: Self-pay | Admitting: Internal Medicine

## 2023-01-04 VITALS — BP 160/102 | HR 77 | Ht 70.0 in | Wt 301.4 lb

## 2023-01-04 DIAGNOSIS — M545 Low back pain, unspecified: Secondary | ICD-10-CM | POA: Diagnosis not present

## 2023-01-04 DIAGNOSIS — R7303 Prediabetes: Secondary | ICD-10-CM

## 2023-01-04 DIAGNOSIS — J4530 Mild persistent asthma, uncomplicated: Secondary | ICD-10-CM | POA: Diagnosis not present

## 2023-01-04 DIAGNOSIS — I10 Essential (primary) hypertension: Secondary | ICD-10-CM | POA: Diagnosis not present

## 2023-01-04 DIAGNOSIS — F418 Other specified anxiety disorders: Secondary | ICD-10-CM

## 2023-01-04 DIAGNOSIS — E782 Mixed hyperlipidemia: Secondary | ICD-10-CM

## 2023-01-04 DIAGNOSIS — G8929 Other chronic pain: Secondary | ICD-10-CM

## 2023-01-04 DIAGNOSIS — R3915 Urgency of urination: Secondary | ICD-10-CM | POA: Insufficient documentation

## 2023-01-04 DIAGNOSIS — M5412 Radiculopathy, cervical region: Secondary | ICD-10-CM

## 2023-01-04 MED ORDER — OLMESARTAN MEDOXOMIL-HCTZ 40-25 MG PO TABS: 1.0000 | ORAL_TABLET | Freq: Every day | ORAL | 3 refills | Status: DC

## 2023-01-04 MED ORDER — FLUOXETINE HCL 20 MG PO CAPS: 20.0000 mg | ORAL_CAPSULE | Freq: Every day | ORAL | 3 refills | Status: AC

## 2023-01-04 MED ORDER — MISC. DEVICES MISC: 0 refills | Status: AC

## 2023-01-04 MED ORDER — NAPROXEN 500 MG PO TABS: 500.0000 mg | ORAL_TABLET | Freq: Two times a day (BID) | ORAL | 0 refills | Status: DC | PRN

## 2023-01-04 NOTE — Progress Notes (Signed)
Established Patient Office Visit  Subjective:  Patient ID: Sherri Campbell, female    DOB: April 25, 1981  Age: 42 y.o. MRN: [id number]  CC:  Chief Complaint  Patient presents with   Hypertension    Follow up, patient is concerned for numbness in her hands and is wondering if it is related to her not taking her blood pressure medication    Pain    Patient has sharp pain in her side    overactive bladder    Patient feels she has an overactive bladder    HPI Sherri Campbell is a 42 y.o. female with past medical history of HTN, asthma, menorrhagia with irregular bleeding, hirsutism, IDA and morbid obesity who presents for f/u of her chronic medical conditions.  HTN: Her BP was elevated in the office today. Of note, she has run out of her olmesartan-HCTZ.  She complains of generalized headache, which is chronic and intermittent. Denies any vision changes, chest pain or palpitations.    Asthma: She uses Symbicort inhaler for dyspnea or wheezing,   Denies any fever, chills or sore throat currently.   She has a history of anxiety with depression, for which she used to take Prozac 20 mg daily.  She has run out of Prozac currently.  She has been stressed lately as she is taking care of her grandson and her daughter (has mental disorder).  She denies any SI or HI currently.  She also reports urinary frequency and urgency, but denies any dysuria, hematuria, fever, chills, nausea or vomiting currently.  Denies any pelvic pain currently.  She also complains of low back pain, radiating to the sides, since 06/29/22.  Denies any numbness or tingling of the LE.  Denies any recent injury.  Denies any dysuria, hematuria or urinary hesitance or resistance. She reports intermittent numbness in the RUE and was concerned about her blood pressure.  She reports that she sleeps in the couch at times, and feels neck pain with arm numbness after it.  Past Medical History:  Diagnosis Date   ADHD (attention deficit  hyperactivity disorder)    Asthma    Cervical high risk HPV (human papillomavirus) test positive 09/19/2020   Depression    Hypertension    Seasonal allergies     Past Surgical History:  Procedure Laterality Date   ovarian cyst removed     TONSILLECTOMY     TUBAL LIGATION      Family History  Problem Relation Age of Onset   Heart disease Mother    Hypertension Mother    Diabetes Father    Hypertension Father    Diabetes Brother     Social History   Socioeconomic History   Marital status: Single    Spouse name: Not on file   Number of children: Not on file   Years of education: Not on file   Highest education level: Not on file  Occupational History   Not on file  Tobacco Use   Smoking status: Never   Smokeless tobacco: Never  Vaping Use   Vaping status: Never Used  Substance and Sexual Activity   Alcohol use: Yes    Comment: occ   Drug use: No   Sexual activity: Yes    Birth control/protection: Surgical    Comment: tubal  Other Topics Concern   Not on file  Social History Narrative   Engaged,lives with fiancee and one daughter.Unemployed.   Social Determinants of Health   Financial Resource Strain: Medium Risk (09/05/2020)  Overall Financial Resource Strain (CARDIA)    Difficulty of Paying Living Expenses: Somewhat hard  Food Insecurity: Food Insecurity Present (09/05/2020)   Hunger Vital Sign    Worried About Running Out of Food in the Last Year: Sometimes true    Ran Out of Food in the Last Year: Sometimes true  Transportation Needs: No Transportation Needs (09/05/2020)   PRAPARE - Administrator, Civil Service (Medical): No    Lack of Transportation (Non-Medical): No  Physical Activity: Insufficiently Active (09/05/2020)   Exercise Vital Sign    Days of Exercise per Week: 3 days    Minutes of Exercise per Session: 30 min  Stress: No Stress Concern Present (09/05/2020)   [employer] of Occupational Health - Occupational Stress  Questionnaire    Feeling of Stress : Only a little  Social Connections: Moderately Integrated (09/05/2020)   Social Connection and Isolation Panel [NHANES]    Frequency of Communication with Friends and Family: Once a week    Frequency of Social Gatherings with Friends and Family: Three times a week    Attends Religious Services: 1 to 4 times per year    Active Member of Clubs or Organizations: No    Attends Banker Meetings: Never    Marital Status: Living with partner  Intimate Partner Violence: Not At Risk (09/05/2020)   Humiliation, Afraid, Rape, and Kick questionnaire    Fear of Current or Ex-Partner: No    Emotionally Abused: No    Physically Abused: No    Sexually Abused: No    Outpatient Medications Prior to Visit  Medication Sig Dispense Refill   albuterol (VENTOLIN HFA) 108 (90 Base) MCG/ACT inhaler Inhale 2 puffs into the lungs every 4 (four) hours as needed for wheezing or shortness of breath. 18 g 3   benzonatate (TESSALON) 100 MG capsule Take 1 capsule (100 mg total) by mouth 2 (two) times daily as needed for cough. 20 capsule 0   budesonide-formoterol (SYMBICORT) 80-4.5 MCG/ACT inhaler Inhale 2 puffs into the lungs 2 (two) times daily. 1 each 3   Iron, Ferrous Sulfate, 325 (65 Fe) MG TABS Take 1 tablet by mouth 2 (two) times daily. 60 tablet 3   medroxyPROGESTERone (PROVERA) 10 MG tablet Take 2 tablets (20 mg total) by mouth 3 (three) times daily for 7 days. 42 tablet 0   ondansetron (ZOFRAN ODT) 4 MG disintegrating tablet Take 1 tablet (4 mg total) by mouth every 8 (eight) hours as needed for nausea or vomiting. 20 tablet 0   progesterone (PROMETRIUM) 200 MG capsule Take 1 capsule (200 mg total) by mouth daily. (Patient not taking: Reported on 08/14/2021) 30 capsule 3   triamcinolone cream (KENALOG) 0.1 % Apply 1 Application topically 2 (two) times daily. 30 g 0   FLUoxetine (PROZAC) 20 MG capsule Take 1 capsule (20 mg total) by mouth daily. 30 capsule 3    naproxen (NAPROSYN) 500 MG tablet Take 1 tablet (500 mg total) by mouth 2 (two) times daily as needed for mild pain or moderate pain. 30 tablet 0   olmesartan-hydrochlorothiazide (BENICAR HCT) 20-12.5 MG tablet TAKE 1 TABLET BY MOUTH DAILY 30 tablet 0   No facility-administered medications prior to visit.    Allergies  Allergen Reactions   Penicillins     ROS Review of Systems  Constitutional:  Negative for chills and fever.  HENT:  Negative for congestion, sinus pressure, sinus pain and sore throat.   Eyes:  Negative for pain and discharge.  Respiratory:  Negative for cough, shortness of breath and wheezing.   Cardiovascular:  Negative for chest pain and palpitations.  Gastrointestinal:  Negative for abdominal pain, diarrhea, nausea and vomiting.  Endocrine: Negative for polydipsia and polyuria.  Genitourinary:  Positive for urgency. Negative for dysuria and hematuria.  Musculoskeletal:  Positive for back pain and neck pain. Negative for neck stiffness.  Skin:  Positive for rash.  Neurological:  Positive for dizziness, numbness (RUE, b/l LE) and headaches. Negative for weakness.  Psychiatric/Behavioral:  Positive for sleep disturbance. Negative for agitation and behavioral problems. The patient is nervous/anxious.       Objective:    Physical Exam Vitals reviewed.  Constitutional:      General: She is not in acute distress.    Appearance: She is obese. She is not diaphoretic.  HENT:     Head: Normocephalic and atraumatic.     Nose: Nose normal.     Mouth/Throat:     Mouth: Mucous membranes are moist.  Eyes:     General: No scleral icterus.    Extraocular Movements: Extraocular movements intact.  Cardiovascular:     Rate and Rhythm: Normal rate and regular rhythm.     Heart sounds: Normal heart sounds. No murmur heard. Pulmonary:     Breath sounds: Normal breath sounds. No wheezing or rales.  Musculoskeletal:     Cervical back: Neck supple. No tenderness.     Right  lower leg: No edema.     Left lower leg: No edema.  Skin:    General: Skin is warm.     Findings: Rash (Erythematous plaques over b/l UE, some lichenified plaques) present.  Neurological:     General: No focal deficit present.     Mental Status: She is alert and oriented to person, place, and time.     Sensory: No sensory deficit.     Motor: No weakness.  Psychiatric:        Mood and Affect: Mood normal.        Behavior: Behavior normal.     BP (!) 160/102 (BP Location: Left Arm)   Pulse 77   Ht 5\' 10"  (1.778 m)   Wt (!) 301 lb 6.4 oz (136.7 kg)   SpO2 96%   BMI 43.25 kg/m  Wt Readings from Last 3 Encounters:  01/04/23 (!) 301 lb 6.4 oz (136.7 kg)  08/28/22 (!) 302 lb 12.8 oz (137.3 kg)  08/14/21 250 lb (113.4 kg)    Lab Results  Component Value Date   TSH 4.150 08/28/2022   Lab Results  Component Value Date   WBC 6.0 08/28/2022   HGB 8.9 (L) 08/28/2022   HCT 30.4 (L) 08/28/2022   MCV 69 (L) 08/28/2022   PLT 471 (H) 08/28/2022   Lab Results  Component Value Date   NA 136 08/28/2022   K 4.3 08/28/2022   CO2 23 08/28/2022   GLUCOSE 102 (H) 08/28/2022   BUN 12 08/28/2022   CREATININE 0.73 08/28/2022   BILITOT <0.2 08/28/2022   ALKPHOS 127 (H) 08/28/2022   AST 22 08/28/2022   ALT 30 08/28/2022   PROT 7.7 08/28/2022   ALBUMIN 4.2 08/28/2022   CALCIUM 8.8 08/28/2022   ANIONGAP 9 08/14/2021   EGFR 106 08/28/2022   Lab Results  Component Value Date   CHOL 227 (H) 08/28/2022   Lab Results  Component Value Date   HDL 40 08/28/2022   Lab Results  Component Value Date   LDLCALC 156 (H) 08/28/2022  Lab Results  Component Value Date   TRIG 171 (H) 08/28/2022   Lab Results  Component Value Date   CHOLHDL 5.7 (H) 08/28/2022   Lab Results  Component Value Date   HGBA1C 6.1 (H) 08/28/2022      Assessment & Plan:   Problem List Items Addressed This Visit       Cardiovascular and Mediastinum   Essential hypertension - Primary    BP Readings  from Last 1 Encounters:  01/04/23 (!) 160/102   uncontrolled due to noncompliance Increased dose of olmesartan HCTZ to 40-25 mg QD, adjust regimen according to response Counseled for compliance with the medications Advised DASH diet and moderate exercise/walking, at least 150 mins/week       Relevant Medications   Misc. Devices MISC   olmesartan-hydrochlorothiazide (BENICAR HCT) 40-25 MG tablet   Other Relevant Orders   CMP14+EGFR     Respiratory   Asthma    Refilled albuterol as needed for dyspnea or wheezing Refilled Symbicort If persistent, will refer to pulmonology        Nervous and Auditory   Cervical radiculopathy    Neck pain with numbness in hands likely cervical radiculopathy Needs to avoid sleeping in couch or recliner Naproxen as needed for pain If persistent, will get imaging and/or neurosurgery evaluation      Relevant Medications   FLUoxetine (PROZAC) 20 MG capsule     Other   Prediabetes    Lab Results  Component Value Date   HGBA1C 6.1 (H) 08/28/2022   Needs to follow low carbohydrate diet      Relevant Orders   Hemoglobin A1c   Depression with anxiety    Was well controlled with Prozac Refilled Prozac now, need to stay compliant      Relevant Medications   FLUoxetine (PROZAC) 20 MG capsule   Chronic bilateral low back pain    Low back pain likely due to muscular strain and/or DDD of lumbar spine Advised to avoid heavy lifting and frequent bending Naproxen as needed for pain Simple back exercises-material provided      Relevant Medications   naproxen (NAPROSYN) 500 MG tablet   FLUoxetine (PROZAC) 20 MG capsule   Urinary urgency    Check UA with reflex culture If negative for UTI, can start oxybutynin or Vesicare      Relevant Orders   UA/M w/rflx Culture, Routine   Mixed hyperlipidemia    Check lipid lipid profile Would benefit from statin, but her compliance is poor to even current medication regimen If she gets better  compliant with her current regimen, will discuss about statin      Relevant Medications   olmesartan-hydrochlorothiazide (BENICAR HCT) 40-25 MG tablet   Other Relevant Orders   Lipid Profile     Meds ordered this encounter  Medications   naproxen (NAPROSYN) 500 MG tablet    Sig: Take 1 tablet (500 mg total) by mouth 2 (two) times daily as needed for mild pain or moderate pain.    Dispense:  30 tablet    Refill:  0   FLUoxetine (PROZAC) 20 MG capsule    Sig: Take 1 capsule (20 mg total) by mouth daily.    Dispense:  30 capsule    Refill:  3   Misc. Devices MISC    Sig: Blood pressure cuff/device - 1. ICD10: I10    Dispense:  1 each    Refill:  0   olmesartan-hydrochlorothiazide (BENICAR HCT) 40-25 MG tablet    Sig:  Take 1 tablet by mouth daily.    Dispense:  30 tablet    Refill:  3    Follow-up: Return in about 4 weeks (around 02/01/2023) for HTN.    Anabel Halon, MD

## 2023-01-04 NOTE — Assessment & Plan Note (Signed)
Neck pain with numbness in hands likely cervical radiculopathy Needs to avoid sleeping in couch or recliner Naproxen as needed for pain If persistent, will get imaging and/or neurosurgery evaluation

## 2023-01-04 NOTE — Patient Instructions (Signed)
Please start taking Olmesartan-hydrochlorothiazide 40-25 mg once daily.  Please start taking Prozac for anxiety and depression.  Please take Naproxen as needed for back pain. Please perform simple back exercises as shown in the material. Please avoid heavy lifting and frequent bending.  Please follow DASH diet and perform moderate exercise/walking at least 150 mins/week.  Please get fasting blood tests done before the next visit.

## 2023-01-04 NOTE — Assessment & Plan Note (Addendum)
Check lipid lipid profile Would benefit from statin, but her compliance is poor to even current medication regimen If she gets better compliant with her current regimen, will discuss about statin

## 2023-01-04 NOTE — Assessment & Plan Note (Signed)
Check UA with reflex culture If negative for UTI, can start oxybutynin or Vesicare

## 2023-01-04 NOTE — Assessment & Plan Note (Signed)
Lab Results  Component Value Date   HGBA1C 6.1 (H) 08/28/2022   Needs to follow low carbohydrate diet

## 2023-01-04 NOTE — Assessment & Plan Note (Signed)
Was well controlled with Prozac Refilled Prozac now, need to stay compliant

## 2023-01-04 NOTE — Assessment & Plan Note (Signed)
Refilled albuterol as needed for dyspnea or wheezing Refilled Symbicort If persistent, will refer to pulmonology

## 2023-01-04 NOTE — Assessment & Plan Note (Signed)
BP Readings from Last 1 Encounters:  01/04/23 (!) 160/102   uncontrolled due to noncompliance Increased dose of olmesartan HCTZ to 40-25 mg QD, adjust regimen according to response Counseled for compliance with the medications Advised DASH diet and moderate exercise/walking, at least 150 mins/week

## 2023-01-04 NOTE — Assessment & Plan Note (Signed)
Low back pain likely due to muscular strain and/or DDD of lumbar spine Advised to avoid heavy lifting and frequent bending Naproxen as needed for pain Simple back exercises-material provided

## 2023-02-02 ENCOUNTER — Ambulatory Visit: Payer: MEDICAID | Admitting: Internal Medicine

## 2023-02-09 ENCOUNTER — Encounter: Payer: Self-pay | Admitting: Internal Medicine

## 2023-12-08 ENCOUNTER — Ambulatory Visit: Payer: MEDICAID | Admitting: Internal Medicine

## 2023-12-17 ENCOUNTER — Other Ambulatory Visit: Payer: Self-pay | Admitting: Internal Medicine

## 2023-12-17 DIAGNOSIS — I10 Essential (primary) hypertension: Secondary | ICD-10-CM

## 2023-12-27 ENCOUNTER — Encounter (HOSPITAL_COMMUNITY): Payer: Self-pay

## 2023-12-27 ENCOUNTER — Other Ambulatory Visit: Payer: Self-pay

## 2023-12-27 ENCOUNTER — Observation Stay (HOSPITAL_COMMUNITY)
Admission: EM | Admit: 2023-12-27 | Discharge: 2023-12-28 | Disposition: A | Discharge status: Home / Self Care | Payer: MEDICAID | Attending: Family Medicine | Admitting: Family Medicine

## 2023-12-27 ENCOUNTER — Emergency Department (HOSPITAL_COMMUNITY): Payer: MEDICAID

## 2023-12-27 DIAGNOSIS — F10920 Alcohol use, unspecified with intoxication, uncomplicated: Secondary | ICD-10-CM | POA: Diagnosis not present

## 2023-12-27 DIAGNOSIS — D500 Iron deficiency anemia secondary to blood loss (chronic): Secondary | ICD-10-CM | POA: Diagnosis not present

## 2023-12-27 DIAGNOSIS — N920 Excessive and frequent menstruation with regular cycle: Secondary | ICD-10-CM | POA: Insufficient documentation

## 2023-12-27 DIAGNOSIS — R42 Dizziness and giddiness: Secondary | ICD-10-CM | POA: Diagnosis present

## 2023-12-27 DIAGNOSIS — R109 Unspecified abdominal pain: Secondary | ICD-10-CM | POA: Insufficient documentation

## 2023-12-27 DIAGNOSIS — J45909 Unspecified asthma, uncomplicated: Secondary | ICD-10-CM | POA: Insufficient documentation

## 2023-12-27 DIAGNOSIS — F32A Depression, unspecified: Secondary | ICD-10-CM | POA: Insufficient documentation

## 2023-12-27 DIAGNOSIS — I10 Essential (primary) hypertension: Secondary | ICD-10-CM | POA: Insufficient documentation

## 2023-12-27 DIAGNOSIS — D649 Anemia, unspecified: Secondary | ICD-10-CM

## 2023-12-27 DIAGNOSIS — R3915 Urgency of urination: Secondary | ICD-10-CM | POA: Insufficient documentation

## 2023-12-27 DIAGNOSIS — D509 Iron deficiency anemia, unspecified: Secondary | ICD-10-CM | POA: Diagnosis not present

## 2023-12-27 LAB — CBC WITH DIFFERENTIAL/PLATELET
Abs Immature Granulocytes: 0.02 K/uL (ref 0.00–0.07)
Basophils Absolute: 0 K/uL (ref 0.0–0.1)
Basophils Relative: 1 %
Eosinophils Absolute: 0.1 K/uL (ref 0.0–0.5)
Eosinophils Relative: 1 %
HCT: 25.9 % — ABNORMAL LOW (ref 36.0–46.0)
Hemoglobin: 6.8 g/dL — CL (ref 12.0–15.0)
Immature Granulocytes: 0 %
Lymphocytes Relative: 34 %
Lymphs Abs: 1.9 K/uL (ref 0.7–4.0)
MCH: 17.2 pg — ABNORMAL LOW (ref 26.0–34.0)
MCHC: 26.3 g/dL — ABNORMAL LOW (ref 30.0–36.0)
MCV: 65.4 fL — ABNORMAL LOW (ref 80.0–100.0)
Monocytes Absolute: 0.5 K/uL (ref 0.1–1.0)
Monocytes Relative: 10 %
Neutro Abs: 3 K/uL (ref 1.7–7.7)
Neutrophils Relative %: 54 %
Platelets: 426 K/uL — ABNORMAL HIGH (ref 150–400)
RBC: 3.96 MIL/uL (ref 3.87–5.11)
RDW: 21.2 % — ABNORMAL HIGH (ref 11.5–15.5)
WBC: 5.5 K/uL (ref 4.0–10.5)
nRBC: 0 % (ref 0.0–0.2)

## 2023-12-27 LAB — D-DIMER, QUANTITATIVE: D-Dimer, Quant: 0.31 ug{FEU}/mL (ref 0.00–0.50)

## 2023-12-27 LAB — FERRITIN: Ferritin: <3 ng/mL — ABNORMAL LOW (ref 11–307)

## 2023-12-27 LAB — COMPREHENSIVE METABOLIC PANEL WITH GFR
ALT: 40 U/L (ref 0–44)
AST: 32 U/L (ref 15–41)
Albumin: 3.3 g/dL — ABNORMAL LOW (ref 3.5–5.0)
Alkaline Phosphatase: 131 U/L — ABNORMAL HIGH (ref 38–126)
Anion gap: 8 (ref 5–15)
BUN: 11 mg/dL (ref 6–20)
CO2: 27 mmol/L (ref 22–32)
Calcium: 8.3 mg/dL — ABNORMAL LOW (ref 8.9–10.3)
Chloride: 102 mmol/L (ref 98–111)
Creatinine, Ser: 0.64 mg/dL (ref 0.44–1.00)
GFR, Estimated: >60 mL/min (ref >60)
Glucose, Bld: 96 mg/dL (ref 70–99)
Potassium: 3.9 mmol/L (ref 3.5–5.1)
Sodium: 137 mmol/L (ref 135–145)
Total Bilirubin: 0.5 mg/dL (ref 0.0–1.2)
Total Protein: 7.7 g/dL (ref 6.5–8.1)

## 2023-12-27 LAB — RETICULOCYTES
Immature Retic Fract: 33.2 % — ABNORMAL HIGH (ref 2.3–15.9)
RBC.: 3.92 MIL/uL (ref 3.87–5.11)
Retic Count, Absolute: 77.2 K/uL (ref 19.0–186.0)
Retic Ct Pct: 2 % (ref 0.4–3.1)

## 2023-12-27 LAB — PREPARE RBC (CROSSMATCH)

## 2023-12-27 LAB — PROTIME-INR
INR: 1 (ref 0.8–1.2)
Prothrombin Time: 13.6 s (ref 11.4–15.2)

## 2023-12-27 LAB — VITAMIN B12: Vitamin B-12: 362 pg/mL (ref 180–914)

## 2023-12-27 LAB — FOLATE: Folate: 8.3 ng/mL (ref >5.9)

## 2023-12-27 LAB — IRON AND TIBC
Iron: 17 ug/dL — ABNORMAL LOW (ref 28–170)
Saturation Ratios: 4 % — ABNORMAL LOW (ref 10.4–31.8)
TIBC: 455 ug/dL — ABNORMAL HIGH (ref 250–450)
UIBC: 438 ug/dL

## 2023-12-27 LAB — TROPONIN I (HIGH SENSITIVITY): Troponin I (High Sensitivity): 8 ng/L (ref <18)

## 2023-12-27 LAB — POC OCCULT BLOOD, ED: Fecal Occult Bld: NEGATIVE

## 2023-12-27 MED ORDER — ALBUTEROL SULFATE (2.5 MG/3ML) 0.083% IN NEBU: 2.5000 mg | INHALATION_SOLUTION | RESPIRATORY_TRACT | Status: DC | PRN

## 2023-12-27 MED ORDER — SODIUM CHLORIDE 0.9% IV SOLUTION: Freq: Once | INTRAVENOUS | Status: DC

## 2023-12-27 MED ORDER — FLUOXETINE HCL 20 MG PO CAPS
20.0000 mg | ORAL_CAPSULE | Freq: Every day | ORAL | Status: DC
  Administered 2023-12-27 – 2023-12-28 (×2): 20 mg via ORAL
  Filled 2023-12-27 (×2): qty 1

## 2023-12-27 MED ORDER — ONDANSETRON HCL 4 MG PO TABS: 4.0000 mg | ORAL_TABLET | Freq: Four times a day (QID) | ORAL | Status: DC | PRN

## 2023-12-27 MED ORDER — HYDRALAZINE HCL 20 MG/ML IJ SOLN
5.0000 mg | Freq: Four times a day (QID) | INTRAMUSCULAR | Status: DC | PRN
  Filled 2023-12-27: qty 1

## 2023-12-27 MED ORDER — ACETAMINOPHEN 325 MG PO TABS
650.0000 mg | ORAL_TABLET | Freq: Four times a day (QID) | ORAL | Status: DC | PRN
  Administered 2023-12-27 – 2023-12-28 (×2): 650 mg via ORAL
  Filled 2023-12-27 (×2): qty 2

## 2023-12-27 MED ORDER — HYDRALAZINE HCL 20 MG/ML IJ SOLN
10.0000 mg | Freq: Four times a day (QID) | INTRAMUSCULAR | Status: DC | PRN
  Administered 2023-12-27 – 2023-12-28 (×2): 10 mg via INTRAVENOUS
  Filled 2023-12-27: qty 1

## 2023-12-27 MED ORDER — OLMESARTAN MEDOXOMIL-HCTZ 40-25 MG PO TABS: 1.0000 | ORAL_TABLET | Freq: Every day | ORAL | Status: DC

## 2023-12-27 MED ORDER — ACETAMINOPHEN 500 MG PO TABS
1000.0000 mg | ORAL_TABLET | Freq: Once | ORAL | Status: AC
  Administered 2023-12-27: 1000 mg via ORAL
  Filled 2023-12-27: qty 2

## 2023-12-27 MED ORDER — IRBESARTAN 150 MG PO TABS
300.0000 mg | ORAL_TABLET | Freq: Every day | ORAL | Status: DC
  Administered 2023-12-27 – 2023-12-28 (×2): 300 mg via ORAL
  Filled 2023-12-27: qty 1
  Filled 2023-12-27 (×2): qty 2

## 2023-12-27 MED ORDER — HYDROCHLOROTHIAZIDE 25 MG PO TABS
25.0000 mg | ORAL_TABLET | Freq: Every day | ORAL | Status: DC
  Administered 2023-12-27 – 2023-12-28 (×2): 25 mg via ORAL
  Filled 2023-12-27 (×2): qty 1

## 2023-12-27 MED ORDER — ACETAMINOPHEN 650 MG RE SUPP: 650.0000 mg | Freq: Four times a day (QID) | RECTAL | Status: DC | PRN

## 2023-12-27 MED ORDER — IRBESARTAN 75 MG PO TABS
37.5000 mg | ORAL_TABLET | Freq: Every day | ORAL | Status: DC
  Filled 2023-12-27: qty 0.5

## 2023-12-27 MED ORDER — ONDANSETRON HCL 4 MG/2ML IJ SOLN: 4.0000 mg | Freq: Four times a day (QID) | INTRAMUSCULAR | Status: DC | PRN

## 2023-12-27 MED ORDER — TRAZODONE HCL 50 MG PO TABS
25.0000 mg | ORAL_TABLET | Freq: Every evening | ORAL | Status: DC | PRN
  Administered 2023-12-27: 25 mg via ORAL
  Filled 2023-12-27: qty 1

## 2023-12-27 NOTE — ED Provider Notes (Signed)
 [facility] AT [facility] Provider Note   CSN: [id number] Arrival date & time: 12/27/23  1229     Patient presents with: Hypertension   Sherri Campbell is a 43 y.o. female history of hypertension, asthma presents with complaints of elevated blood pressure readings, chest pain, left-sided headache.  She reports vision changes including seeing spots.  She endorses feeling dizzy with near syncope.  No history of cardiac issues or blood clots.    Hypertension Associated symptoms include chest pain.      Past Medical History:  Diagnosis Date   ADHD (attention deficit hyperactivity disorder)    Asthma    Cervical high risk HPV (human papillomavirus) test positive 09/19/2020   Depression    Hypertension    Seasonal allergies      Prior to Admission medications   Medication Sig Start Date End Date Taking? Authorizing Provider  albuterol  (VENTOLIN  HFA) 108 (90 Base) MCG/ACT inhaler Inhale 2 puffs into the lungs every 4 (four) hours as needed for wheezing or shortness of breath. 08/28/22   Tobie Suzzane POUR, MD  benzonatate  (TESSALON ) 100 MG capsule Take 1 capsule (100 mg total) by mouth 2 (two) times daily as needed for cough. 08/28/22   Tobie Suzzane POUR, MD  budesonide -formoterol  (SYMBICORT ) 80-4.5 MCG/ACT inhaler Inhale 2 puffs into the lungs 2 (two) times daily. 08/28/22   Tobie Suzzane POUR, MD  FLUoxetine  (PROZAC ) 20 MG capsule Take 1 capsule (20 mg total) by mouth daily. 01/04/23   Tobie Suzzane POUR, MD  Iron , Ferrous Sulfate , 325 (65 Fe) MG TABS Take 1 tablet by mouth 2 (two) times daily. 04/14/21   Tobie Suzzane POUR, MD  medroxyPROGESTERone  (PROVERA ) 10 MG tablet Take 2 tablets (20 mg total) by mouth 3 (three) times daily for 7 days. 08/14/21 08/21/21  Roemhildt, Lorin T, PA-C  Misc. Devices MISC Blood pressure cuff/device - 1. ICD10: I10 01/04/23   Tobie Suzzane POUR, MD  naproxen  (NAPROSYN ) 500 MG tablet Take 1 tablet (500 mg total) by mouth 2 (two) times daily as  needed for mild pain or moderate pain. 01/04/23   Tobie Suzzane POUR, MD  olmesartan -hydrochlorothiazide  (BENICAR  HCT) 40-25 MG tablet Take 1 tablet by mouth daily. 01/04/23   Tobie Suzzane POUR, MD  ondansetron  (ZOFRAN  ODT) 4 MG disintegrating tablet Take 1 tablet (4 mg total) by mouth every 8 (eight) hours as needed for nausea or vomiting. 01/25/21   Long, Fonda MATSU, MD  progesterone  (PROMETRIUM ) 200 MG capsule Take 1 capsule (200 mg total) by mouth daily. Patient not taking: Reported on 08/14/2021 08/15/20   Gosrani, Nimish C, MD  triamcinolone  cream (KENALOG ) 0.1 % Apply 1 Application topically 2 (two) times daily. 08/28/22   Tobie Suzzane POUR, MD    Allergies: Penicillins    Review of Systems  Cardiovascular:  Positive for chest pain.     Updated Vital Signs BP (!) 166/91   Pulse 63   Temp 98.6 F (37 C)   Resp 20   SpO2 98%   Physical Exam Vitals and nursing note reviewed.  Constitutional:      General: She is not in acute distress.    Appearance: She is well-developed.  HENT:     Head: Normocephalic and atraumatic.  Eyes:     Conjunctiva/sclera: Conjunctivae normal.  Cardiovascular:     Rate and Rhythm: Normal rate and regular rhythm.     Heart sounds: No murmur heard. Pulmonary:     Effort: Pulmonary effort is normal. No  respiratory distress.     Breath sounds: Normal breath sounds.  Abdominal:     Palpations: Abdomen is soft.     Tenderness: There is no abdominal tenderness.  Musculoskeletal:        General: No swelling.     Cervical back: Neck supple.  Skin:    General: Skin is warm and dry.     Capillary Refill: Capillary refill takes less than 2 seconds.  Neurological:     Mental Status: She is alert.     Comments: Patient is alert and oriented. There is no abnormal phonation. Symmetric smile without facial droop.  Moves all extremities spontaneously. 5/5 strength in upper and lower extremities. . No sensation deficit. There is no nystagmus. EOMI, PERRL. Coordination  intact with finger to nose and normal ambulation.    Psychiatric:        Mood and Affect: Mood normal.     (all labs ordered are listed, but only abnormal results are displayed) Labs Reviewed  CBC WITH DIFFERENTIAL/PLATELET - Abnormal; Notable for the following components:      Result Value   Hemoglobin 6.8 (*)    HCT 25.9 (*)    MCV 65.4 (*)    MCH 17.2 (*)    MCHC 26.3 (*)    RDW 21.2 (*)    Platelets 426 (*)    All other components within normal limits  COMPREHENSIVE METABOLIC PANEL WITH GFR - Abnormal; Notable for the following components:   Calcium  8.3 (*)    Albumin 3.3 (*)    Alkaline Phosphatase 131 (*)    All other components within normal limits  D-DIMER, QUANTITATIVE  URINALYSIS, ROUTINE W REFLEX MICROSCOPIC  VITAMIN B12  FOLATE  IRON  AND TIBC  FERRITIN  RETICULOCYTES  PROTIME-INR  HIV ANTIBODY (ROUTINE TESTING W REFLEX)  BASIC METABOLIC PANEL WITH GFR  CBC  POC OCCULT BLOOD, ED  TYPE AND SCREEN  PREPARE RBC (CROSSMATCH)  TROPONIN I (HIGH SENSITIVITY)  TROPONIN I (HIGH SENSITIVITY)    EKG: EKG Interpretation Date/Time:  Monday December 27 2023 12:39:00 EDT Ventricular Rate:  69 PR Interval:  160 QRS Duration:  103 QT Interval:  453 QTC Calculation: 486 R Axis:   -5  Text Interpretation: Sinus rhythm Abnormal R-wave progression, early transition Left ventricular hypertrophy Borderline prolonged QT interval No significant change since last tracing Confirmed by Zackowski, Scott [phone]) on 12/27/2023 12:42:11 PM  Radiology: CT Head Wo Contrast Result Date: 12/27/2023 CLINICAL DATA:  Headache, neuro deficit, hypertension, vision changes. EXAM: CT HEAD WITHOUT CONTRAST TECHNIQUE: Contiguous axial images were obtained from the base of the skull through the vertex without intravenous contrast. RADIATION DOSE REDUCTION: This exam was performed according to the departmental dose-optimization program which includes automated exposure control, adjustment of the mA  and/or kV according to patient size and/or use of iterative reconstruction technique. COMPARISON:  None Available. FINDINGS: Brain: No acute intracranial hemorrhage. No CT evidence of acute infarct. No edema, mass effect, or midline shift. The basilar cisterns are patent. Ventricles: The ventricles are normal. Vascular: No hyperdense vessel or unexpected calcification. Skull: No acute or aggressive finding. Chronic appearing left nasal bone deformity. Orbits: Orbits are symmetric. Sinuses: The visualized paranasal sinuses are clear. Other: Left mastoid effusion. IMPRESSION: No CT evidence of acute intracranial abnormality. Left mastoid effusion. Electronically Signed   By: Donnice Mania M.D.   On: 12/27/2023 14:39     Procedures   Medications Ordered in the ED  0.9 %  sodium chloride  infusion (Manually program  via Guardrails IV Fluids) (has no administration in time range)  acetaminophen  (TYLENOL ) tablet 1,000 mg (has no administration in time range)  acetaminophen  (TYLENOL ) tablet 650 mg (has no administration in time range)    Or  acetaminophen  (TYLENOL ) suppository 650 mg (has no administration in time range)  traZODone  (DESYREL ) tablet 25 mg (has no administration in time range)  ondansetron  (ZOFRAN ) tablet 4 mg (has no administration in time range)    Or  ondansetron  (ZOFRAN ) injection 4 mg (has no administration in time range)  albuterol  (PROVENTIL ) (2.5 MG/3ML) 0.083% nebulizer solution 2.5 mg (has no administration in time range)  olmesartan -hydrochlorothiazide  (BENICAR  HCT) 40-25 MG per tablet 1 tablet (has no administration in time range)  hydrALAZINE  (APRESOLINE ) injection 5 mg (has no administration in time range)                                    Medical Decision Making Amount and/or Complexity of Data Reviewed Labs: ordered. Radiology: ordered.   This patient presents to the ED with chief complaint(s) of elevated BP.  The complaint involves an extensive differential  diagnosis and also carries with it a high risk of complications and morbidity.   Pertinent past medical history as listed in HPI  The differential diagnosis includes  Send hypertension, CVA, TIA, subarachnoid hemorrhage, ACS, PE Additional history obtained: Records reviewed Care Everywhere/External Records  Assessment and management:   Patient presents hypertensive with complaints of elevated blood pressure readings over the past week with associated chest pain, headaches, dizziness and near syncope.  Has been out of her blood pressure medication.  Has no cardiac history or prior blood clots.  Patient is PERC negative.  Will obtain routine labs, EKG.  Patient noted to have a hemoglobin of 6.8.  She does endorse having darker stools recently.  Has no history of GI bleeds.  She is not on blood thinners.  Patient will need admission for blood transfusion and further evaluation.  Patient does report that she has not been able to take her blood pressure medication recently (olmesartan -hydrochlorothiazide ).  She is currently with systolics in the 170s.  Will administer a dose of irbesartan  here in the ED.  CT head negative for intracranial abnormality, mastoid effusion noted, on exam patient has no mastoid tenderness, swelling or erythema   Independent ECG interpretation:  Sinus rhythm, LVH, abnormal R wave progression  Independent labs interpretation:  The following labs were independently interpreted:  CMP with alk phos of 130, appears to be at baseline, CBC with hemoglobin of 6.8, troponin of 8, D-dimer within normal limits  Independent visualization and interpretation of imaging: I independently visualized the following imaging with scope of interpretation limited to determining acute life threatening conditions related to emergency care:  CT head with mastoid effusion, otherwise no acute intracranial abnormality   Consultations obtained:   Hospitalist Dr. Zella agreed for  admission  Disposition:   Patient be admitted for blood transfusion and further management.  Social Determinants of Health:   none  This note was dictated with voice recognition software.  Despite best efforts at proofreading, errors may have occurred which can change the documentation meaning.        Final diagnoses:  Anemia, unspecified type    ED Discharge Orders     None          Donnajean Lynwood VEAR DEVONNA 12/27/23 1719    Geraldene Hamilton, MD 12/28/23 [phone]

## 2023-12-27 NOTE — ED Triage Notes (Signed)
 Pt BIBA for hypertension.  Per EMS pt has been out of losartan for approximately one month.  Pt reports vision changes, seeing spots, headache 8/10, pain in central chest.

## 2023-12-27 NOTE — H&P (Signed)
 History and Physical  Sherri Campbell FMW:[id number] DOB: 1981/01/24 DOA: 12/27/2023  PCP: Tobie Suzzane POUR, MD   Chief Complaint: Bilateral flank discomfort, dark stools, dizziness, headache  HPI: Sherri Campbell is a 43 y.o. female with medical history significant for depression, hypertension, morbid obesity heavy menses and iron  deficiency anemia being admitted to the hospital with uncontrolled hypertension due to medication noncompliance, and symptomatic anemia likely due to iron  deficiency from heavy menses.  Patient states that she has always had very heavy periods the last over a week.  She used to take supplemental iron , but has not in the last couple of years.  She also was supposed to be on antihypertensive medication, but ran out over a month ago and has not been taking it.  She recently had her PCP call in some medications, but he only called in the Prozac  so that is the only medication that she has been taking.  For the last several weeks, she has been feeling little more lethargic than usual, dizzy and lightheaded.  She denies any frank blood in her stool, but thinks that for the last couple of weeks her stools have looked quite black.  She denies any abdominal pain, fevers or nausea.  She also notes that for the last few weeks, she has some urinary urgency, with bilateral flank discomfort.  However she denies any dysuria or fevers.  Workup in the ER today shows acute on chronic anemia, stool is negative for Hemoccult blood.  ER provider has ordered blood transfusion, and requested hospitalist admission.  Review of Systems: Please see HPI for pertinent positives and negatives. A complete 10 system review of systems are otherwise negative.  Past Medical History:  Diagnosis Date   ADHD (attention deficit hyperactivity disorder)    Asthma    Cervical high risk HPV (human papillomavirus) test positive 09/19/2020   Depression    Hypertension    Seasonal allergies    Past Surgical History:   Procedure Laterality Date   ovarian cyst removed     TONSILLECTOMY     TUBAL LIGATION     Social History:  reports that she has never smoked. She has never used smokeless tobacco. She reports current alcohol use. She reports that she does not use drugs.  Allergies  Allergen Reactions   Penicillins     Family History  Problem Relation Age of Onset   Heart disease Mother    Hypertension Mother    Diabetes Father    Hypertension Father    Diabetes Brother      Prior to Admission medications   Medication Sig Start Date End Date Taking? Authorizing Provider  albuterol  (VENTOLIN  HFA) 108 (90 Base) MCG/ACT inhaler Inhale 2 puffs into the lungs every 4 (four) hours as needed for wheezing or shortness of breath. 08/28/22   Tobie Suzzane POUR, MD  benzonatate  (TESSALON ) 100 MG capsule Take 1 capsule (100 mg total) by mouth 2 (two) times daily as needed for cough. 08/28/22   Tobie Suzzane POUR, MD  budesonide -formoterol  (SYMBICORT ) 80-4.5 MCG/ACT inhaler Inhale 2 puffs into the lungs 2 (two) times daily. 08/28/22   Tobie Suzzane POUR, MD  FLUoxetine  (PROZAC ) 20 MG capsule Take 1 capsule (20 mg total) by mouth daily. 01/04/23   Tobie Suzzane POUR, MD  Iron , Ferrous Sulfate , 325 (65 Fe) MG TABS Take 1 tablet by mouth 2 (two) times daily. 04/14/21   Tobie Suzzane POUR, MD  medroxyPROGESTERone  (PROVERA ) 10 MG tablet Take 2 tablets (20 mg total) by  mouth 3 (three) times daily for 7 days. 08/14/21 08/21/21  Roemhildt, Lorin T, PA-C  Misc. Devices MISC Blood pressure cuff/device - 1. ICD10: I10 01/04/23   Tobie Suzzane POUR, MD  naproxen  (NAPROSYN ) 500 MG tablet Take 1 tablet (500 mg total) by mouth 2 (two) times daily as needed for mild pain or moderate pain. 01/04/23   Tobie Suzzane POUR, MD  olmesartan -hydrochlorothiazide  (BENICAR  HCT) 40-25 MG tablet Take 1 tablet by mouth daily. 01/04/23   Tobie Suzzane POUR, MD  ondansetron  (ZOFRAN  ODT) 4 MG disintegrating tablet Take 1 tablet (4 mg total) by mouth every 8 (eight) hours as  needed for nausea or vomiting. 01/25/21   Long, Fonda MATSU, MD  progesterone  (PROMETRIUM ) 200 MG capsule Take 1 capsule (200 mg total) by mouth daily. Patient not taking: Reported on 08/14/2021 08/15/20   Gosrani, Nimish C, MD  triamcinolone  cream (KENALOG ) 0.1 % Apply 1 Application topically 2 (two) times daily. 08/28/22   Tobie Suzzane POUR, MD    Physical Exam: BP (!) 166/91   Pulse 63   Temp 98.6 F (37 C)   Resp 20   SpO2 98%  General:  Alert, oriented, calm, in no acute distress, morbidly obese, resting comfortably on room air Cardiovascular: RRR, no murmurs or rubs, no peripheral edema  Respiratory: clear to auscultation bilaterally, no wheezes, no crackles  Abdomen: soft, nontender, nondistended, normal bowel tones heard  Skin: dry, no rashes  Musculoskeletal: no joint effusions, normal range of motion  Psychiatric: appropriate affect, normal speech  Neurologic: extraocular muscles intact, clear speech, moving all extremities with intact sensorium         Labs on Admission:  Basic Metabolic Panel: Recent Labs  Lab 12/27/23 1330  NA 137  K 3.9  CL 102  CO2 27  GLUCOSE 96  BUN 11  CREATININE 0.64  CALCIUM  8.3*   Liver Function Tests: Recent Labs  Lab 12/27/23 1330  AST 32  ALT 40  ALKPHOS 131*  BILITOT 0.5  PROT 7.7  ALBUMIN 3.3*   No results for input(s): LIPASE, AMYLASE in the last 168 hours. No results for input(s): AMMONIA in the last 168 hours. CBC: Recent Labs  Lab 12/27/23 1330  WBC 5.5  NEUTROABS 3.0  HGB 6.8*  HCT 25.9*  MCV 65.4*  PLT 426*   Cardiac Enzymes: No results for input(s): CKTOTAL, CKMB, CKMBINDEX, TROPONINI in the last 168 hours. BNP (last 3 results) No results for input(s): BNP in the last 8760 hours.  ProBNP (last 3 results) No results for input(s): PROBNP in the last 8760 hours.  CBG: No results for input(s): GLUCAP in the last 168 hours.  Radiological Exams on Admission: CT Head Wo Contrast Result Date:  12/27/2023 CLINICAL DATA:  Headache, neuro deficit, hypertension, vision changes. EXAM: CT HEAD WITHOUT CONTRAST TECHNIQUE: Contiguous axial images were obtained from the base of the skull through the vertex without intravenous contrast. RADIATION DOSE REDUCTION: This exam was performed according to the departmental dose-optimization program which includes automated exposure control, adjustment of the mA and/or kV according to patient size and/or use of iterative reconstruction technique. COMPARISON:  None Available. FINDINGS: Brain: No acute intracranial hemorrhage. No CT evidence of acute infarct. No edema, mass effect, or midline shift. The basilar cisterns are patent. Ventricles: The ventricles are normal. Vascular: No hyperdense vessel or unexpected calcification. Skull: No acute or aggressive finding. Chronic appearing left nasal bone deformity. Orbits: Orbits are symmetric. Sinuses: The visualized paranasal sinuses are clear. Other: Left mastoid effusion.  IMPRESSION: No CT evidence of acute intracranial abnormality. Left mastoid effusion. Electronically Signed   By: Donnice Mania M.D.   On: 12/27/2023 14:39   Assessment/Plan Breeona INETTA DICKE is a 43 y.o. female with medical history significant for depression, hypertension, morbid obesity heavy menses and iron  deficiency anemia being admitted to the hospital with uncontrolled hypertension due to medication noncompliance, and symptomatic anemia likely due to iron  deficiency from heavy menses.  Symptomatic anemia-she does have a history of iron  deficiency anemia due to heavy menses.  Supposed to be on iron  supplementation but has not been compliant with this.  I suspect she has chronic anemia from her menses, no clear indication for active GI bleeding although she has had some dark stools. -Observation admission -Continue to avoid blood thinners, avoid NSAIDs -Transfuse 2 unit PRBC -Check anemia panel prior to transfusion -Continue oral iron   supplementation, may benefit from IV iron   Urinary urgency and flank pain-there is possibility of UTI, though she has not had any fever, has no leukocytosis. -Check urinalysis for evidence of infection  Hypertension-currently uncontrolled, likely due to medication noncompliance -Resume home Benicar  HCT -IV hydralazine  as needed for uncontrolled blood pressure  Depression-Prozac   DVT prophylaxis: SCDs only    Code Status: Full Code  Consults called: None  Admission status: Observation  Time spent: 49 minutes  Arjay Jaskiewicz CHRISTELLA Gail MD [facility] Pager [phone]  If 7PM-7AM, please contact night-coverage [email or web address] Password [email or web address]  12/27/2023, 5:23 PM

## 2023-12-28 ENCOUNTER — Other Ambulatory Visit (HOSPITAL_COMMUNITY): Payer: Self-pay | Admitting: Family Medicine

## 2023-12-28 ENCOUNTER — Telehealth (HOSPITAL_COMMUNITY): Payer: Self-pay | Admitting: Pharmacy Technician

## 2023-12-28 ENCOUNTER — Other Ambulatory Visit (HOSPITAL_COMMUNITY): Payer: Self-pay

## 2023-12-28 ENCOUNTER — Telehealth (HOSPITAL_COMMUNITY): Payer: Self-pay

## 2023-12-28 DIAGNOSIS — D500 Iron deficiency anemia secondary to blood loss (chronic): Secondary | ICD-10-CM | POA: Diagnosis not present

## 2023-12-28 DIAGNOSIS — I10 Essential (primary) hypertension: Secondary | ICD-10-CM | POA: Diagnosis not present

## 2023-12-28 LAB — BASIC METABOLIC PANEL WITH GFR
Anion gap: 13 (ref 5–15)
BUN: 10 mg/dL (ref 6–20)
CO2: 23 mmol/L (ref 22–32)
Calcium: 8.6 mg/dL — ABNORMAL LOW (ref 8.9–10.3)
Chloride: 99 mmol/L (ref 98–111)
Creatinine, Ser: 0.52 mg/dL (ref 0.44–1.00)
GFR, Estimated: >60 mL/min (ref >60)
Glucose, Bld: 125 mg/dL — ABNORMAL HIGH (ref 70–99)
Potassium: 3.3 mmol/L — ABNORMAL LOW (ref 3.5–5.1)
Sodium: 135 mmol/L (ref 135–145)

## 2023-12-28 LAB — CBC
HCT: 32.2 % — ABNORMAL LOW (ref 36.0–46.0)
Hemoglobin: 9.1 g/dL — ABNORMAL LOW (ref 12.0–15.0)
MCH: 19 pg — ABNORMAL LOW (ref 26.0–34.0)
MCHC: 28.3 g/dL — ABNORMAL LOW (ref 30.0–36.0)
MCV: 67.1 fL — ABNORMAL LOW (ref 80.0–100.0)
Platelets: 426 K/uL — ABNORMAL HIGH (ref 150–400)
RBC: 4.8 MIL/uL (ref 3.87–5.11)
RDW: 22.7 % — ABNORMAL HIGH (ref 11.5–15.5)
WBC: 7.4 K/uL (ref 4.0–10.5)
nRBC: 0 % (ref 0.0–0.2)

## 2023-12-28 LAB — HIV ANTIBODY (ROUTINE TESTING W REFLEX): HIV Screen 4th Generation wRfx: NONREACTIVE

## 2023-12-28 MED ORDER — POTASSIUM CHLORIDE CRYS ER 20 MEQ PO TBCR
40.0000 meq | EXTENDED_RELEASE_TABLET | Freq: Once | ORAL | Status: AC
  Administered 2023-12-28: 40 meq via ORAL
  Filled 2023-12-28: qty 2

## 2023-12-28 MED ORDER — OLMESARTAN MEDOXOMIL-HCTZ 40-25 MG PO TABS
1.0000 | ORAL_TABLET | Freq: Every day | ORAL | 0 refills | Status: AC
  Filled 2023-12-28: qty 30, 30d supply, fill #0

## 2023-12-28 MED ORDER — IRON (FERROUS SULFATE) 325 (65 FE) MG PO TABS: 1.0000 | ORAL_TABLET | Freq: Every day | ORAL | Status: AC

## 2023-12-28 MED ORDER — TRAMADOL HCL 50 MG PO TABS
25.0000 mg | ORAL_TABLET | Freq: Once | ORAL | Status: AC
  Administered 2023-12-28: 25 mg via ORAL
  Filled 2023-12-28: qty 1

## 2023-12-28 MED ORDER — OLMESARTAN MEDOXOMIL-HCTZ 40-25 MG PO TABS: 1.0000 | ORAL_TABLET | Freq: Every day | ORAL | 0 refills | Status: DC

## 2023-12-28 NOTE — Plan of Care (Signed)

## 2023-12-28 NOTE — Discharge Instructions (Addendum)
 [facility] Department of Health and [employer] Aging and adult services Children's services Deaf-Blind services Disability services Guardianship Hearing loss (assistive technology, interpreters, etc.) Low-income services (health care, child care, housing, financial and nutrition assistance) Medicaid Pregnancy services Utility assistance Veteran's services [phone] [address] [place], [place] [address]

## 2023-12-28 NOTE — Hospital Course (Signed)
 65F symptomatic anemia likely from heavy menses   43 y.o. female with medical history significant for depression, hypertension, morbid obesity heavy menses and iron  deficiency anemia being admitted to the hospital with uncontrolled hypertension due to medication noncompliance, and symptomatic anemia likely due to iron  deficiency from heavy menses.  Patient states that she has always had very heavy periods the last over a week.  She used to take supplemental iron , but has not in the last couple of years.  She also was supposed to be on antihypertensive medication, but ran out over a month ago and has not been taking it.  She recently had her PCP call in some medications, but he only called in the Prozac  so that is the only medication that she has been taking.  For the last several weeks, she has been feeling little more lethargic than usual, dizzy and lightheaded.  She denies any frank blood in her stool, but thinks that for the last couple of weeks her stools have looked quite black.  She denies any abdominal pain, fevers or nausea.  She also notes that for the last few weeks, she has some urinary urgency, with bilateral flank discomfort.  However she denies any dysuria or fevers.  Workup in the ER today shows acute on chronic anemia, stool is negative for Hemoccult blood.  ER provider has ordered blood transfusion, and requested hospitalist admission.   Consultants ***  Procedures/Events ***

## 2023-12-28 NOTE — TOC Initial Note (Signed)
 Transition of Care [facility]) - Initial/Assessment Note    Patient Details  Name: Sherri Campbell MRN: [id number] Date of Birth: 06-25-1980  Transition of Care [facility] CM/SW Contact:    Doneta Glenys DASEN, RN Phone Number: 12/28/2023, 3:22 PM  Clinical Narrative:                 Patient presented vision changes, seeing spots, headache 8/10, pain in central chest. CM spoke with patient and states PTA she lives at [facility] on [address]. Patient states she moved from [facility] and needs to move other resources. CM has added resources for [facility] in [place]. Patient friend will transport at discharge. TOC signing off.  Expected Discharge Plan: Home/Self Care Barriers to Discharge: Barriers Resolved   Patient Goals and CMS Choice Patient states their goals for this hospitalization and ongoing recovery are:: Return to [facility] [email or web address] Compare Post Acute Care list provided to::  (NA) Choice offered to / list presented to : NA [name] ownership interest in [facility].provided to:: Parent NA    Expected Discharge Plan and Services In-house Referral: NA Discharge Planning Services: CM Consult Post Acute Care Choice: NA Living arrangements for the past 2 months: Hotel/Motel ([facility] [address]) Expected Discharge Date: 12/28/23               DME Arranged: N/A DME Agency: NA       HH Arranged: NA HH Agency: NA        Prior Living Arrangements/Services Living arrangements for the past 2 months: Hotel/Motel ([facility] [address]) Lives with:: Self Patient language and need for interpreter reviewed:: Yes Do you feel safe going back to the place where you live?: Yes      Need for Family Participation in Patient Care: No (Comment) Care giver support system in place?: Yes (comment) Current home services:  (NA) Criminal Activity/Legal Involvement Pertinent to Current Situation/Hospitalization: No - Comment as  needed  Activities of Daily Living   ADL Screening (condition at time of admission) Independently performs ADLs?: Yes (appropriate for developmental age) Is the patient deaf or have difficulty hearing?: No Does the patient have difficulty seeing, even when wearing glasses/contacts?: No Does the patient have difficulty concentrating, remembering, or making decisions?: No  Permission Sought/Granted Permission sought to share information with : Case Manager Permission granted to share information with : Yes, Verbal Permission Granted  Share Information with NAME: Gerald, [place] (Father)  [phone] (Home Phone)           Emotional Assessment Appearance:: Appears stated age Attitude/Demeanor/Rapport: Engaged Affect (typically observed): Appropriate Orientation: : Oriented to Self, Oriented to Place, Oriented to  Time, Oriented to Situation Alcohol / Substance Use: Not Applicable Psych Involvement: No (comment)  Admission diagnosis:  Blood loss anemia [D50.0] Anemia, unspecified type [D64.9] Patient Active Problem List   Diagnosis Date Noted   Blood loss anemia 12/27/2023   Urinary urgency 01/04/2023   Cervical radiculopathy 01/04/2023   Mixed hyperlipidemia 01/04/2023   Chronic bilateral low back pain 08/28/2022   Bilateral impacted cerumen 08/28/2022   Cervical high risk HPV (human papillomavirus) test positive 09/19/2020   Menorrhagia with irregular cycle 09/05/2020   Depression with anxiety 12/06/2018   Iron  deficiency anemia 01/18/2016   Prediabetes 01/16/2016   PCOS (polycystic ovarian syndrome) 02/18/2015   Irregular bleeding 02/18/2015   Essential hypertension 01/31/2010   HIRSUTISM 01/31/2010   Morbid obesity (HCC) 07/12/2008   Allergic rhinitis 11/10/2006  Asthma 08/05/2006   PCP:  Tobie Suzzane POUR, MD Pharmacy:   [facility] [phone] - [place], [place] - [address] AT [facility] OF [address] [place]  [address] Phone: [phone] Fax: [phone]  [facility] [phone] - [place],  - [address] AT [facility] OF [address] [facility] [address] [place] [address] Phone: [phone] Fax: [phone]     Social Drivers of Health (SDOH) Social History: SDOH Screenings   Food Insecurity: No Food Insecurity (12/27/2023)  Housing: High Risk (12/27/2023)  Transportation Needs: Unmet Transportation Needs (12/27/2023)  Utilities: Not At Risk (12/27/2023)  Alcohol Screen: Low Risk  (09/05/2020)  Depression (PHQ2-9): High Risk (01/04/2023)  Financial Resource Strain: Medium Risk (12/08/2023)  Physical Activity: Insufficiently Active (12/08/2023)  Social Connections: Moderately Isolated (12/08/2023)  Stress: Stress Concern Present (12/08/2023)  Tobacco Use: Low Risk  (12/27/2023)   SDOH Interventions: Housing Interventions: Inpatient TOC, Community Resources Provided Transportation Interventions: Intervention Not Indicated, Inpatient TOC   Readmission Risk Interventions     No data to display

## 2023-12-28 NOTE — Telephone Encounter (Signed)
 Patient referred to infusion pharmacy team for ambulatory infusion of IV iron .  Insurance - [employer] Tailored Plan Site of care - Site of care: [facility] Dx code - D50.9 IV Iron  Therapy - Feraheme 510 mg x 2 Infusion appointments - Scheduling team will schedule patient as soon as possible.   Norton Blush, PharmD Pharmacist [facility]

## 2023-12-28 NOTE — Telephone Encounter (Signed)
 Auth Submission: NO AUTH NEEDED Site of care: [facility] Payer: [employer] Tailored Plan (Medicaid) Medication & CPT/J Code(s) submitted: Feraheme (ferumoxytol) [id number] Diagnosis Code:  Route of submission (phone, fax, portal): phone Phone # [phone] Fax # Auth type: Buy/Bill HB Units/visits requested: 510mg  x 2 doses Reference number: [id number], spoke to [facility] Approval from: 12/28/23 to 04/29/24    Dagoberto Armour, CPhT [facility] Phone: [phone] 12/28/2023

## 2023-12-28 NOTE — Discharge Summary (Signed)
 Physician Discharge Summary   Patient: Sherri Campbell MRN: [id number] DOB: December 02, 1980  Admit date:     12/27/2023  Discharge date: 12/28/23  Discharge Physician: Toribio Door   PCP: Tobie Suzzane POUR, MD   Recommendations at discharge:  Follow-up heavy menstrual bleeding and severe iron  deficiency anemia, see discussion below  Discharge Diagnoses: Principal Problem: Symptomatic anemia Severe iron  deficiency anemia Heavy menstrual bleeding Chronic urinary urgency, no evidence of infection Hypertension, uncontrolled  Hospital Course: 43 year old woman with lifelong history of very heavy menses, presented with feeling lethargic, dizzy and lightheaded.  Found to be anemic and admitted for blood transfusion.  Consultants None   Procedures/Events Transfusion 2 units PRBC  Symptomatic anemia Severe iron  deficiency anemia Heavy menstrual bleeding Admitted with symptomatic anemia secondary to heavy menstrual bleeding.  Has had heavy bleeding since adolescence.  Unclear when last seen by GYN or what remedies have been pursued in the past. Transfused 2 units PRBCs with symptomatic improvement as well as improvement in hemoglobin as expected Will start oral iron  on discharge and referred to outpatient infusion clinic for IV iron  Patient now lives in [place], counseled her to establish with GYN here through her insurance plan. Murmur heard but patient reports she has a murmur each time she is ill and this resolves after recovery.  Recommend follow-up as an outpatient.  Chronic urinary urgency, no evidence of infection    Hypertension, uncontrolled Out of medication, will refill   Depression-Prozac   Disposition: Home Diet recommendation:  Regular diet DISCHARGE MEDICATION: Allergies as of 12/28/2023       Reactions   Penicillins Hives        Medication List     TAKE these medications    acetaminophen  500 MG tablet Commonly known as: TYLENOL  Take 500 mg by mouth  every 6 (six) hours as needed for moderate pain (pain score 4-6).   albuterol  108 (90 Base) MCG/ACT inhaler Commonly known as: VENTOLIN  HFA Inhale 2 puffs into the lungs every 4 (four) hours as needed for wheezing or shortness of breath.   budesonide -formoterol  80-4.5 MCG/ACT inhaler Commonly known as: SYMBICORT  Inhale 2 puffs into the lungs 2 (two) times daily.   FLUoxetine  20 MG capsule Commonly known as: PROZAC  Take 1 capsule (20 mg total) by mouth daily.   Iron  (Ferrous Sulfate ) 325 (65 Fe) MG Tabs Take 1 tablet by mouth daily at 6 (six) AM. What changed: when to take this   Misc. Devices Misc Blood pressure cuff/device - 1. ICD10: I10   olmesartan -hydrochlorothiazide  40-25 MG tablet Commonly known as: BENICAR  HCT Take 1 tablet by mouth daily.        Follow-up Information     Tobie Suzzane POUR, MD Follow up in 2 week(s).   Specialty: Internal Medicine Contact information: [address] [place] [address] [phone]                Feels better  Discharge Exam: Filed Weights   12/27/23 1846  Weight: 130.7 kg   Physical Exam Vitals reviewed.  Constitutional:      General: She is not in acute distress.    Appearance: She is not ill-appearing or toxic-appearing.  Cardiovascular:     Rate and Rhythm: Normal rate and regular rhythm.     Heart sounds: No murmur heard. Pulmonary:     Effort: Pulmonary effort is normal. No respiratory distress.     Breath sounds: No wheezing, rhonchi or rales.  Neurological:     Mental Status: She is  alert.  Psychiatric:        Mood and Affect: Mood normal.        Behavior: Behavior normal.      Condition at discharge: good  The results of significant diagnostics from this hospitalization (including imaging, microbiology, ancillary and laboratory) are listed below for reference.   Imaging Studies: CT Head Wo Contrast Result Date: 12/27/2023 CLINICAL DATA:  Headache, neuro deficit, hypertension, vision  changes. EXAM: CT HEAD WITHOUT CONTRAST TECHNIQUE: Contiguous axial images were obtained from the base of the skull through the vertex without intravenous contrast. RADIATION DOSE REDUCTION: This exam was performed according to the departmental dose-optimization program which includes automated exposure control, adjustment of the mA and/or kV according to patient size and/or use of iterative reconstruction technique. COMPARISON:  None Available. FINDINGS: Brain: No acute intracranial hemorrhage. No CT evidence of acute infarct. No edema, mass effect, or midline shift. The basilar cisterns are patent. Ventricles: The ventricles are normal. Vascular: No hyperdense vessel or unexpected calcification. Skull: No acute or aggressive finding. Chronic appearing left nasal bone deformity. Orbits: Orbits are symmetric. Sinuses: The visualized paranasal sinuses are clear. Other: Left mastoid effusion. IMPRESSION: No CT evidence of acute intracranial abnormality. Left mastoid effusion. Electronically Signed   By: Donnice Mania M.D.   On: 12/27/2023 14:39    Microbiology: Results for orders placed or performed in visit on 01/04/23  Microscopic Examination     Status: None   Collection Time: 01/04/23  9:45 AM  Result Value Ref Range Status   WBC, UA None seen 0 - 5 /hpf Final   RBC, Urine None seen 0 - 2 /hpf Final   Epithelial Cells (non renal) None seen 0 - 10 /hpf Final   Casts None seen None seen /lpf Final   Bacteria, UA None seen None seen/Few Final    Labs: CBC: Recent Labs  Lab 12/27/23 1330 12/28/23 1030  WBC 5.5 7.4  NEUTROABS 3.0  --   HGB 6.8* 9.1*  HCT 25.9* 32.2*  MCV 65.4* 67.1*  PLT 426* 426*   Basic Metabolic Panel: Recent Labs  Lab 12/27/23 1330 12/28/23 1030  NA 137 135  K 3.9 3.3*  CL 102 99  CO2 27 23  GLUCOSE 96 125*  BUN 11 10  CREATININE 0.64 0.52  CALCIUM  8.3* 8.6*   Liver Function Tests: Recent Labs  Lab 12/27/23 1330  AST 32  ALT 40  ALKPHOS 131*  BILITOT  0.5  PROT 7.7  ALBUMIN 3.3*   CBG: No results for input(s): GLUCAP in the last 168 hours.  Discharge time spent: greater than 30 minutes.  Signed: Toribio Door, MD [facility] 12/28/2023

## 2023-12-28 NOTE — Progress Notes (Signed)
Discharge instructions given to patient. D Akirah Storck RN 

## 2023-12-29 ENCOUNTER — Telehealth: Payer: Self-pay

## 2023-12-29 LAB — TYPE AND SCREEN
ABO/RH(D): A NEG
Antibody Screen: NEGATIVE
Unit division: 0
Unit division: 0

## 2023-12-29 LAB — BPAM RBC
Blood Product Expiration Date: 202508032359
Blood Product Expiration Date: 202508032359
ISSUE DATE / TIME: 202507212317
ISSUE DATE / TIME: 202507220303
Unit Type and Rh: 600
Unit Type and Rh: 600

## 2023-12-29 NOTE — Transitions of Care (Post Inpatient/ED Visit) (Signed)
   12/29/2023  Name: Sherri Campbell MRN: [id number] DOB: 01/23/1981  Today's TOC FU Call Status: Today's TOC FU Call Status:: Successful TOC FU Call Completed TOC FU Call Complete Date: 12/29/23 Patient's Name and Date of Birth confirmed.  Transition Care Management Follow-up Telephone Call Date of Discharge: 12/28/23 Discharge Facility: [facility]) Type of Discharge: Inpatient Admission Primary Inpatient Discharge Diagnosis:: anemia How have you been since you were released from the hospital?: Better Any questions or concerns?: No  Items Reviewed: Did you receive and understand the discharge instructions provided?: Yes Medications obtained,verified, and reconciled?: Yes (Medications Reviewed) Any new allergies since your discharge?: No Dietary orders reviewed?: Yes Do you have support at home?: Yes People in Home [RPT]: child(ren), adult  Medications Reviewed Today: Medications Reviewed Today     Reviewed by Emmitt Pan, LPN (Licensed Practical Nurse) on 12/29/23 at 1147  Med List Status: <None>   Medication Order Taking? Sig Documenting Provider Last Dose Status Informant  acetaminophen  (TYLENOL ) 500 MG tablet [id number] Yes Take 500 mg by mouth every 6 (six) hours as needed for moderate pain (pain score 4-6). [provider]  Active Self, Pharmacy Records  albuterol  (VENTOLIN  HFA) 108 [phone] Base) MCG/ACT inhaler [id number] Yes Inhale 2 puffs into the lungs every 4 (four) hours as needed for wheezing or shortness of breath. Tobie Suzzane POUR, MD  Active Self, Pharmacy Records  budesonide -formoterol  (SYMBICORT ) 80-4.5 MCG/ACT inhaler [id number] Yes Inhale 2 puffs into the lungs 2 (two) times daily. Tobie Suzzane POUR, MD  Active Self, Pharmacy Records  FLUoxetine  (PROZAC ) 20 MG capsule [id number] Yes Take 1 capsule (20 mg total) by mouth daily. Tobie Suzzane POUR, MD  Active Self, Pharmacy Records  Iron , Ferrous Sulfate , 325 (65 Fe) MG TABS [id number] Yes Take 1 tablet by mouth  daily at 6 (six) AM. Jadine Toribio SQUIBB, MD  Active   Misc. Devices MISC [id number] Yes Blood pressure cuff/device - 1. ICD10: I10 Tobie Suzzane POUR, MD  Active Self, Pharmacy Records  olmesartan -hydrochlorothiazide  (BENICAR  HCT) 40-25 MG tablet [id number] Yes Take 1 tablet by mouth daily. Jadine Toribio SQUIBB, MD  Active             Home Care and Equipment/Supplies: Were Home Health Services Ordered?: NA Any new equipment or medical supplies ordered?: NA  Functional Questionnaire: Do you need assistance with bathing/showering or dressing?: No Do you need assistance with meal preparation?: No Do you need assistance with eating?: No Do you have difficulty maintaining continence: No Do you need assistance with getting out of bed/getting out of a chair/moving?: No Do you have difficulty managing or taking your medications?: No  Follow up appointments reviewed: PCP Follow-up appointment confirmed?: Yes Date of PCP follow-up appointment?: 01/05/24 Follow-up Provider: [facility] Follow-up appointment confirmed?: Yes Reason Specialist Follow-Up Not Confirmed: Patient has Specialist Provider Number and will Call for Appointment Do you need transportation to your follow-up appointment?: No Do you understand care options if your condition(s) worsen?: Yes-patient verbalized understanding    SIGNATURE Pan Emmitt, LPN [facility] Nurse Health Advisor Direct Dial [phone]

## 2024-01-05 ENCOUNTER — Inpatient Hospital Stay: Payer: MEDICAID | Admitting: Family Medicine

## 2024-01-05 ENCOUNTER — Inpatient Hospital Stay (HOSPITAL_COMMUNITY): Admission: RE | Admit: 2024-01-05 | Payer: MEDICAID | Source: Ambulatory Visit

## 2024-01-12 ENCOUNTER — Encounter: Payer: Self-pay | Admitting: Internal Medicine

## 2024-01-12 ENCOUNTER — Inpatient Hospital Stay (HOSPITAL_COMMUNITY): Admission: RE | Admit: 2024-01-12 | Payer: MEDICAID | Source: Ambulatory Visit

## 2024-01-14 ENCOUNTER — Inpatient Hospital Stay (HOSPITAL_COMMUNITY): Admission: RE | Admit: 2024-01-14 | Payer: MEDICAID | Source: Ambulatory Visit

## 2024-01-19 ENCOUNTER — Encounter (HOSPITAL_COMMUNITY): Payer: MEDICAID

## 2024-01-21 ENCOUNTER — Ambulatory Visit (HOSPITAL_COMMUNITY)
Admission: RE | Admit: 2024-01-21 | Discharge: 2024-01-21 | Disposition: A | Discharge status: Home / Self Care | Payer: MEDICAID | Source: Ambulatory Visit | Attending: Family Medicine | Admitting: Family Medicine

## 2024-01-21 DIAGNOSIS — D509 Iron deficiency anemia, unspecified: Secondary | ICD-10-CM | POA: Insufficient documentation

## 2024-01-21 MED ORDER — SODIUM CHLORIDE 0.9 % IV SOLN
510.0000 mg | INTRAVENOUS | Status: DC
  Administered 2024-01-21: 510 mg via INTRAVENOUS
  Filled 2024-01-21: qty 510

## 2024-01-26 ENCOUNTER — Ambulatory Visit: Payer: MEDICAID | Admitting: Internal Medicine

## 2024-01-28 ENCOUNTER — Ambulatory Visit (HOSPITAL_COMMUNITY)
Admission: RE | Admit: 2024-01-28 | Discharge: 2024-01-28 | Disposition: A | Discharge status: Home / Self Care | Payer: MEDICAID | Source: Ambulatory Visit | Attending: Family Medicine | Admitting: Family Medicine

## 2024-01-28 DIAGNOSIS — D509 Iron deficiency anemia, unspecified: Secondary | ICD-10-CM | POA: Diagnosis not present

## 2024-01-28 MED ORDER — SODIUM CHLORIDE 0.9 % IV SOLN
510.0000 mg | INTRAVENOUS | Status: DC
  Administered 2024-01-28: 510 mg via INTRAVENOUS
  Filled 2024-01-28: qty 510

## 2024-02-02 ENCOUNTER — Encounter: Payer: Self-pay | Admitting: Internal Medicine

## 2024-02-13 ENCOUNTER — Other Ambulatory Visit: Payer: Self-pay | Admitting: Internal Medicine

## 2024-02-13 DIAGNOSIS — F418 Other specified anxiety disorders: Secondary | ICD-10-CM

## 2024-04-17 ENCOUNTER — Other Ambulatory Visit: Payer: Self-pay | Admitting: Internal Medicine

## 2024-04-17 DIAGNOSIS — F418 Other specified anxiety disorders: Secondary | ICD-10-CM
